# Patient Record
Sex: Female | Born: 1935 | Race: White | Hispanic: No | Smoking: Never smoker
Health system: Southern US, Community
[De-identification: ages and names within clinical notes are randomized; demographics above are authoritative.]

## PROBLEM LIST (undated history)

## (undated) DIAGNOSIS — K56609 Unspecified intestinal obstruction, unspecified as to partial versus complete obstruction: Secondary | ICD-10-CM

## (undated) HISTORY — PX: OTHER SURGICAL HISTORY: SHX169

## (undated) HISTORY — DX: Unspecified intestinal obstruction, unspecified as to partial versus complete obstruction: K56.609

## (undated) HISTORY — PX: SMALL INTESTINE SURGERY: SHX150

## (undated) HISTORY — PX: CATARACT EXTRACTION, BILATERAL: SHX1313

---

## 1973-03-14 HISTORY — PX: CHOLECYSTECTOMY: SHX55

## 2008-04-10 ENCOUNTER — Encounter: Admission: RE | Admit: 2008-04-10 | Discharge: 2008-04-10 | Payer: Self-pay | Admitting: Family Medicine

## 2008-10-13 ENCOUNTER — Ambulatory Visit (HOSPITAL_COMMUNITY): Admission: RE | Admit: 2008-10-13 | Discharge: 2008-10-13 | Payer: Self-pay | Admitting: Urology

## 2008-10-24 ENCOUNTER — Encounter: Admission: RE | Admit: 2008-10-24 | Discharge: 2008-10-24 | Payer: Self-pay | Admitting: Family Medicine

## 2010-07-18 ENCOUNTER — Emergency Department (INDEPENDENT_AMBULATORY_CARE_PROVIDER_SITE_OTHER): Payer: Medicare Other

## 2010-07-18 ENCOUNTER — Emergency Department (HOSPITAL_BASED_OUTPATIENT_CLINIC_OR_DEPARTMENT_OTHER)
Admission: EM | Admit: 2010-07-18 | Discharge: 2010-07-18 | Disposition: A | Discharge status: Home / Self Care | Payer: Medicare Other | Attending: Emergency Medicine | Admitting: Emergency Medicine

## 2010-07-18 DIAGNOSIS — K5900 Constipation, unspecified: Secondary | ICD-10-CM

## 2010-07-18 DIAGNOSIS — M4120 Other idiopathic scoliosis, site unspecified: Secondary | ICD-10-CM

## 2010-07-18 DIAGNOSIS — M549 Dorsalgia, unspecified: Secondary | ICD-10-CM | POA: Insufficient documentation

## 2010-07-18 DIAGNOSIS — M545 Low back pain, unspecified: Secondary | ICD-10-CM | POA: Insufficient documentation

## 2010-07-18 DIAGNOSIS — F0390 Unspecified dementia without behavioral disturbance: Secondary | ICD-10-CM | POA: Insufficient documentation

## 2010-07-18 DIAGNOSIS — M810 Age-related osteoporosis without current pathological fracture: Secondary | ICD-10-CM | POA: Insufficient documentation

## 2010-07-18 LAB — URINALYSIS, ROUTINE W REFLEX MICROSCOPIC
Bilirubin Urine: NEGATIVE
Glucose, UA: NEGATIVE mg/dL
Hgb urine dipstick: NEGATIVE
Ketones, ur: NEGATIVE mg/dL
Leukocytes, UA: NEGATIVE
Nitrite: NEGATIVE
Protein, ur: NEGATIVE mg/dL
Specific Gravity, Urine: 1.015 (ref 1.005–1.030)
Urobilinogen, UA: 0.2 mg/dL (ref 0.0–1.0)
pH: 8.5 — ABNORMAL HIGH (ref 5.0–8.0)

## 2010-07-18 LAB — URINE MICROSCOPIC-ADD ON

## 2010-07-18 LAB — HEMOCCULT GUIAC POC 1CARD (OFFICE): Fecal Occult Bld: NEGATIVE

## 2011-04-05 ENCOUNTER — Emergency Department (HOSPITAL_COMMUNITY): Payer: Medicare Other

## 2011-04-05 ENCOUNTER — Encounter (HOSPITAL_COMMUNITY): Payer: Self-pay | Admitting: General Practice

## 2011-04-05 ENCOUNTER — Inpatient Hospital Stay (HOSPITAL_COMMUNITY)
Admission: EM | Admit: 2011-04-05 | Discharge: 2011-04-14 | DRG: 982 | Disposition: A | Discharge status: Skilled Nursing Facility | Payer: Medicare Other | Attending: General Surgery | Admitting: General Surgery

## 2011-04-05 ENCOUNTER — Encounter (HOSPITAL_COMMUNITY): Admission: EM | Disposition: A | Discharge status: Skilled Nursing Facility | Payer: Self-pay | Source: Home / Self Care

## 2011-04-05 ENCOUNTER — Emergency Department (HOSPITAL_COMMUNITY): Payer: Medicare Other | Admitting: Anesthesiology

## 2011-04-05 ENCOUNTER — Encounter (HOSPITAL_COMMUNITY): Payer: Self-pay | Admitting: Anesthesiology

## 2011-04-05 DIAGNOSIS — D214 Benign neoplasm of connective and other soft tissue of abdomen: Principal | ICD-10-CM | POA: Diagnosis present

## 2011-04-05 DIAGNOSIS — D126 Benign neoplasm of colon, unspecified: Secondary | ICD-10-CM

## 2011-04-05 DIAGNOSIS — K56609 Unspecified intestinal obstruction, unspecified as to partial versus complete obstruction: Secondary | ICD-10-CM

## 2011-04-05 DIAGNOSIS — D490 Neoplasm of unspecified behavior of digestive system: Secondary | ICD-10-CM | POA: Diagnosis present

## 2011-04-05 DIAGNOSIS — K5900 Constipation, unspecified: Secondary | ICD-10-CM | POA: Diagnosis present

## 2011-04-05 DIAGNOSIS — K6389 Other specified diseases of intestine: Secondary | ICD-10-CM

## 2011-04-05 DIAGNOSIS — Z96619 Presence of unspecified artificial shoulder joint: Secondary | ICD-10-CM

## 2011-04-05 DIAGNOSIS — K460 Unspecified abdominal hernia with obstruction, without gangrene: Secondary | ICD-10-CM | POA: Diagnosis present

## 2011-04-05 DIAGNOSIS — M810 Age-related osteoporosis without current pathological fracture: Secondary | ICD-10-CM | POA: Diagnosis present

## 2011-04-05 DIAGNOSIS — F068 Other specified mental disorders due to known physiological condition: Secondary | ICD-10-CM | POA: Diagnosis present

## 2011-04-05 DIAGNOSIS — K560 Paralytic ileus: Secondary | ICD-10-CM | POA: Diagnosis not present

## 2011-04-05 DIAGNOSIS — Z79899 Other long term (current) drug therapy: Secondary | ICD-10-CM

## 2011-04-05 DIAGNOSIS — R32 Unspecified urinary incontinence: Secondary | ICD-10-CM | POA: Diagnosis present

## 2011-04-05 DIAGNOSIS — R5381 Other malaise: Secondary | ICD-10-CM | POA: Diagnosis not present

## 2011-04-05 HISTORY — PX: LAPAROTOMY: SHX154

## 2011-04-05 LAB — COMPREHENSIVE METABOLIC PANEL
ALT: 9 U/L (ref 0–35)
AST: 14 U/L (ref 0–37)
Albumin: 3.3 g/dL — ABNORMAL LOW (ref 3.5–5.2)
Calcium: 10.2 mg/dL (ref 8.4–10.5)
Creatinine, Ser: 0.86 mg/dL (ref 0.50–1.10)
GFR calc non Af Amer: 57 mL/min — ABNORMAL LOW (ref >90)
Sodium: 141 mEq/L (ref 135–145)
Total Protein: 6.8 g/dL (ref 6.0–8.3)

## 2011-04-05 LAB — DIFFERENTIAL
Basophils Absolute: 0 10*3/uL (ref 0.0–0.1)
Basophils Relative: 0 % (ref 0–1)
Eosinophils Absolute: 0 10*3/uL (ref 0.0–0.7)
Eosinophils Relative: 0 % (ref 0–5)
Lymphocytes Relative: 6 % — ABNORMAL LOW (ref 12–46)
Monocytes Absolute: 0.4 10*3/uL (ref 0.1–1.0)

## 2011-04-05 LAB — URINALYSIS, ROUTINE W REFLEX MICROSCOPIC
Glucose, UA: NEGATIVE mg/dL
Hgb urine dipstick: NEGATIVE
Leukocytes, UA: NEGATIVE
Protein, ur: NEGATIVE mg/dL
Specific Gravity, Urine: 1.025 (ref 1.005–1.030)

## 2011-04-05 LAB — CBC
HCT: 41.3 % (ref 36.0–46.0)
MCHC: 32.7 g/dL (ref 30.0–36.0)
MCV: 92 fL (ref 78.0–100.0)
Platelets: 349 10*3/uL (ref 150–400)
RDW: 14.3 % (ref 11.5–15.5)
WBC: 12 10*3/uL — ABNORMAL HIGH (ref 4.0–10.5)

## 2011-04-05 LAB — LACTIC ACID, PLASMA: Lactic Acid, Venous: 1 mmol/L (ref 0.5–2.2)

## 2011-04-05 SURGERY — LAPAROTOMY, EXPLORATORY
Anesthesia: General | Wound class: Dirty or Infected

## 2011-04-05 MED ORDER — IOHEXOL 300 MG/ML  SOLN
100.0000 mL | Freq: Once | INTRAMUSCULAR | Status: AC | PRN
  Administered 2011-04-05: 100 mL via INTRAVENOUS

## 2011-04-05 MED ORDER — HYDROMORPHONE HCL PF 1 MG/ML IJ SOLN
INTRAMUSCULAR | Status: AC
  Filled 2011-04-05: qty 1

## 2011-04-05 MED ORDER — PROMETHAZINE HCL 25 MG/ML IJ SOLN
INTRAMUSCULAR | Status: AC
  Filled 2011-04-05: qty 1

## 2011-04-05 MED ORDER — SUCCINYLCHOLINE CHLORIDE 20 MG/ML IJ SOLN
INTRAMUSCULAR | Status: DC | PRN
  Administered 2011-04-05: 80 mg via INTRAVENOUS

## 2011-04-05 MED ORDER — 0.9 % SODIUM CHLORIDE (POUR BTL) OPTIME
TOPICAL | Status: DC | PRN
  Administered 2011-04-05: 1000 mL

## 2011-04-05 MED ORDER — ONDANSETRON HCL 4 MG/2ML IJ SOLN: 4.0000 mg | Freq: Four times a day (QID) | INTRAMUSCULAR | Status: DC | PRN

## 2011-04-05 MED ORDER — DIPHENHYDRAMINE HCL 50 MG/ML IJ SOLN
12.5000 mg | Freq: Four times a day (QID) | INTRAMUSCULAR | Status: DC | PRN
  Administered 2011-04-09 – 2011-04-10 (×2): 12.5 mg via INTRAVENOUS
  Filled 2011-04-05 (×2): qty 1

## 2011-04-05 MED ORDER — LIDOCAINE HCL (CARDIAC) 20 MG/ML IV SOLN
INTRAVENOUS | Status: DC | PRN
  Administered 2011-04-05: 75 mg via INTRAVENOUS

## 2011-04-05 MED ORDER — DIPHENHYDRAMINE HCL 12.5 MG/5ML PO ELIX: 12.5000 mg | ORAL_SOLUTION | Freq: Four times a day (QID) | ORAL | Status: DC | PRN

## 2011-04-05 MED ORDER — ONDANSETRON HCL 4 MG/2ML IJ SOLN
4.0000 mg | Freq: Once | INTRAMUSCULAR | Status: AC
  Administered 2011-04-05: 4 mg via INTRAVENOUS
  Filled 2011-04-05: qty 2

## 2011-04-05 MED ORDER — CHLORHEXIDINE GLUCONATE 4 % EX LIQD: 1.0000 "application " | Freq: Once | CUTANEOUS | Status: DC

## 2011-04-05 MED ORDER — NALOXONE HCL 0.4 MG/ML IJ SOLN: 0.4000 mg | INTRAMUSCULAR | Status: DC | PRN

## 2011-04-05 MED ORDER — LACTATED RINGERS IV SOLN: INTRAVENOUS | Status: DC

## 2011-04-05 MED ORDER — CEFOXITIN SODIUM-DEXTROSE 1-4 GM-% IV SOLR (PREMIX)
INTRAVENOUS | Status: AC
  Filled 2011-04-05: qty 50

## 2011-04-05 MED ORDER — CEFOXITIN SODIUM 1 G IV SOLR
1.0000 g | Freq: Four times a day (QID) | INTRAVENOUS | Status: AC
  Administered 2011-04-05: 1 g via INTRAVENOUS
  Filled 2011-04-05: qty 1

## 2011-04-05 MED ORDER — ENOXAPARIN SODIUM 40 MG/0.4ML ~~LOC~~ SOLN
40.0000 mg | SUBCUTANEOUS | Status: DC
  Administered 2011-04-06 – 2011-04-14 (×8): 40 mg via SUBCUTANEOUS
  Filled 2011-04-05 (×10): qty 0.4

## 2011-04-05 MED ORDER — PROMETHAZINE HCL 25 MG/ML IJ SOLN: 6.2500 mg | INTRAMUSCULAR | Status: DC | PRN

## 2011-04-05 MED ORDER — ONDANSETRON HCL 4 MG PO TABS: 4.0000 mg | ORAL_TABLET | Freq: Four times a day (QID) | ORAL | Status: DC | PRN

## 2011-04-05 MED ORDER — HYDROMORPHONE 0.3 MG/ML IV SOLN
INTRAVENOUS | Status: DC
  Administered 2011-04-05: 0.3 mg via INTRAVENOUS
  Administered 2011-04-06: 1.8 mg via INTRAVENOUS
  Administered 2011-04-06: 1.5 mg via INTRAVENOUS
  Administered 2011-04-06: 19:00:00 via INTRAVENOUS
  Administered 2011-04-06: 0.56 mg via INTRAVENOUS
  Administered 2011-04-07: 1.2 mg via INTRAVENOUS
  Administered 2011-04-07: 0.6 mg via INTRAVENOUS
  Filled 2011-04-05 (×2): qty 25

## 2011-04-05 MED ORDER — ONDANSETRON HCL 4 MG/2ML IJ SOLN
4.0000 mg | Freq: Once | INTRAMUSCULAR | Status: AC
  Administered 2011-04-05: 4 mg via INTRAVENOUS

## 2011-04-05 MED ORDER — LACTATED RINGERS IV SOLN
INTRAVENOUS | Status: DC | PRN
  Administered 2011-04-05 (×3): via INTRAVENOUS

## 2011-04-05 MED ORDER — ONDANSETRON HCL 4 MG/2ML IJ SOLN
INTRAMUSCULAR | Status: AC
  Administered 2011-04-05: 4 mg via INTRAVENOUS
  Filled 2011-04-05: qty 2

## 2011-04-05 MED ORDER — SUFENTANIL CITRATE 50 MCG/ML IV SOLN
INTRAVENOUS | Status: DC | PRN
  Administered 2011-04-05: 20 ug via INTRAVENOUS
  Administered 2011-04-05 (×3): 10 ug via INTRAVENOUS

## 2011-04-05 MED ORDER — CISATRACURIUM BESYLATE 2 MG/ML IV SOLN
INTRAVENOUS | Status: DC | PRN
  Administered 2011-04-05: 10 mg via INTRAVENOUS
  Administered 2011-04-05: 3 mg via INTRAVENOUS

## 2011-04-05 MED ORDER — PROPOFOL 10 MG/ML IV EMUL
INTRAVENOUS | Status: DC | PRN
  Administered 2011-04-05 (×2): 100 mg via INTRAVENOUS

## 2011-04-05 MED ORDER — NEOSTIGMINE METHYLSULFATE 1 MG/ML IJ SOLN
INTRAMUSCULAR | Status: DC | PRN
  Administered 2011-04-05: 5 mg via INTRAVENOUS

## 2011-04-05 MED ORDER — MORPHINE SULFATE 2 MG/ML IJ SOLN
2.0000 mg | Freq: Once | INTRAMUSCULAR | Status: AC
  Administered 2011-04-05: 2 mg via INTRAVENOUS
  Filled 2011-04-05: qty 1

## 2011-04-05 MED ORDER — ACETAMINOPHEN 10 MG/ML IV SOLN
INTRAVENOUS | Status: DC | PRN
  Administered 2011-04-05: 1000 mg via INTRAVENOUS

## 2011-04-05 MED ORDER — SODIUM CHLORIDE 0.9 % IV BOLUS (SEPSIS)
500.0000 mL | Freq: Once | INTRAVENOUS | Status: AC
  Administered 2011-04-05: 500 mL via INTRAVENOUS

## 2011-04-05 MED ORDER — HETASTARCH-ELECTROLYTES 6 % IV SOLN
INTRAVENOUS | Status: DC | PRN
  Administered 2011-04-05: 16:00:00 via INTRAVENOUS

## 2011-04-05 MED ORDER — HYDROMORPHONE HCL PF 1 MG/ML IJ SOLN
0.2500 mg | INTRAMUSCULAR | Status: DC | PRN
  Administered 2011-04-05 (×2): 0.25 mg via INTRAVENOUS

## 2011-04-05 MED ORDER — SODIUM CHLORIDE 0.9 % IJ SOLN: 9.0000 mL | INTRAMUSCULAR | Status: DC | PRN

## 2011-04-05 MED ORDER — DEXTROSE 5 % IV SOLN
1.0000 g | INTRAVENOUS | Status: DC | PRN
  Administered 2011-04-05: 2 g via INTRAVENOUS

## 2011-04-05 MED ORDER — EPHEDRINE SULFATE 50 MG/ML IJ SOLN
INTRAMUSCULAR | Status: DC | PRN
  Administered 2011-04-05 (×2): 10 mg via INTRAVENOUS

## 2011-04-05 MED ORDER — GLYCOPYRROLATE 0.2 MG/ML IJ SOLN
INTRAMUSCULAR | Status: DC | PRN
  Administered 2011-04-05: .8 mg via INTRAVENOUS

## 2011-04-05 MED ORDER — DEXTROSE 5 % IV SOLN: 1.0000 g | INTRAVENOUS | Status: DC | PRN

## 2011-04-05 MED ORDER — DEXTROSE 5 % IV SOLN: 1.0000 g | INTRAVENOUS | Status: DC

## 2011-04-05 MED ORDER — KCL IN DEXTROSE-NACL 20-5-0.45 MEQ/L-%-% IV SOLN
INTRAVENOUS | Status: DC
  Administered 2011-04-06 – 2011-04-07 (×3): via INTRAVENOUS
  Filled 2011-04-05 (×5): qty 1000

## 2011-04-05 MED ORDER — ACETAMINOPHEN 10 MG/ML IV SOLN
INTRAVENOUS | Status: AC
  Filled 2011-04-05: qty 100

## 2011-04-05 SURGICAL SUPPLY — 46 items
APPLICATOR COTTON TIP 6IN STRL (MISCELLANEOUS) ×2 IMPLANT
BLADE EXTENDED COATED 6.5IN (ELECTRODE) IMPLANT
BLADE HEX COATED 2.75 (ELECTRODE) ×2 IMPLANT
CANISTER SUCTION 2500CC (MISCELLANEOUS) ×2 IMPLANT
CLOTH BEACON ORANGE TIMEOUT ST (SAFETY) ×2 IMPLANT
COVER MAYO STAND STRL (DRAPES) IMPLANT
DRAPE LAPAROSCOPIC ABDOMINAL (DRAPES) ×2 IMPLANT
DRAPE UTILITY XL STRL (DRAPES) ×2 IMPLANT
DRAPE WARM FLUID 44X44 (DRAPE) ×2 IMPLANT
DRSG PAD ABDOMINAL 8X10 ST (GAUZE/BANDAGES/DRESSINGS) ×2 IMPLANT
ELECT REM PT RETURN 9FT ADLT (ELECTROSURGICAL) ×2
ELECTRODE REM PT RTRN 9FT ADLT (ELECTROSURGICAL) ×1 IMPLANT
GAUZE SPONGE 4X4 12PLY STRL LF (GAUZE/BANDAGES/DRESSINGS) ×2 IMPLANT
GLOVE BIOGEL PI IND STRL 7.0 (GLOVE) ×1 IMPLANT
GLOVE BIOGEL PI INDICATOR 7.0 (GLOVE) ×1
GLOVE INDICATOR 8.0 STRL GRN (GLOVE) ×4 IMPLANT
GLOVE SS BIOGEL STRL SZ 8 (GLOVE) ×2 IMPLANT
GLOVE SUPERSENSE BIOGEL SZ 8 (GLOVE) ×2
GOWN STRL NON-REIN LRG LVL3 (GOWN DISPOSABLE) ×2 IMPLANT
GOWN STRL REIN XL XLG (GOWN DISPOSABLE) ×4 IMPLANT
KIT BASIN OR (CUSTOM PROCEDURE TRAY) ×2 IMPLANT
LIGASURE IMPACT 36 18CM CVD LR (INSTRUMENTS) ×2 IMPLANT
NS IRRIG 1000ML POUR BTL (IV SOLUTION) ×6 IMPLANT
PACK GENERAL/GYN (CUSTOM PROCEDURE TRAY) ×2 IMPLANT
RELOAD PROXIMATE 75MM BLUE (ENDOMECHANICALS) ×10 IMPLANT
RELOAD PROXIMATE TA60MM BLUE (ENDOMECHANICALS) ×2 IMPLANT
SPONGE GAUZE 4X4 12PLY (GAUZE/BANDAGES/DRESSINGS) ×2 IMPLANT
SPONGE LAP 18X18 X RAY DECT (DISPOSABLE) ×6 IMPLANT
STAPLER GUN LINEAR PROX 60 (STAPLE) ×2 IMPLANT
STAPLER PROXIMATE 75MM BLUE (STAPLE) ×6 IMPLANT
STAPLER VISISTAT 35W (STAPLE) ×2 IMPLANT
SUCTION POOLE TIP (SUCTIONS) ×2 IMPLANT
SUT PDS AB 1 CT1 27 (SUTURE) ×2 IMPLANT
SUT PDS AB 1 CTX 36 (SUTURE) IMPLANT
SUT PDS AB 1 TP1 96 (SUTURE) ×4 IMPLANT
SUT SILK 2 0 (SUTURE) ×1
SUT SILK 2-0 18XBRD TIE 12 (SUTURE) ×1 IMPLANT
SUT SILK 3 0 (SUTURE) ×1
SUT SILK 3 0 SH CR/8 (SUTURE) ×2 IMPLANT
SUT SILK 3-0 18XBRD TIE 12 (SUTURE) ×1 IMPLANT
SUT VIC AB 2-0 SH 18 (SUTURE) IMPLANT
SUT VIC AB 3-0 SH 18 (SUTURE) ×6 IMPLANT
TAPE CLOTH SURG 4X10 WHT LF (GAUZE/BANDAGES/DRESSINGS) ×2 IMPLANT
TOWEL OR 17X26 10 PK STRL BLUE (TOWEL DISPOSABLE) ×2 IMPLANT
TRAY FOLEY CATH 14FRSI W/METER (CATHETERS) ×2 IMPLANT
YANKAUER SUCT BULB TIP NO VENT (SUCTIONS) ×2 IMPLANT

## 2011-04-05 NOTE — Anesthesia Postprocedure Evaluation (Signed)
  Anesthesia Post-op Note  Patient: Destiny Cochran  Procedure(s) Performed:  EXPLORATORY LAPAROTOMY - Cecectomy, small bowel resection  Patient Location: PACU  Anesthesia Type: General  Level of Consciousness: awake and alert   Airway and Oxygen Therapy: Patient Spontanous Breathing  Post-op Pain: mild  Post-op Assessment: Post-op Vital signs reviewed, Patient's Cardiovascular Status Stable, Respiratory Function Stable, Patent Airway and No signs of Nausea or vomiting  Post-op Vital Signs: stable  Complications: No apparent anesthesia complications

## 2011-04-05 NOTE — Anesthesia Preprocedure Evaluation (Addendum)
Anesthesia Evaluation  Patient identified by MRN, date of birth, ID band Patient awake    Reviewed: Allergy & Precautions, H&P , NPO status , Patient's Chart, lab work & pertinent test results  Airway Mallampati: III TM Distance: <3 FB Neck ROM: full    Dental  (+) Caps,    Pulmonary neg pulmonary ROS,  clear to auscultation  Pulmonary exam normal       Cardiovascular Exercise Tolerance: Good neg cardio ROS regular Normal    Neuro/Psych Negative Neurological ROS  Negative Psych ROS   GI/Hepatic negative GI ROS, Neg liver ROS,   Endo/Other  Negative Endocrine ROS  Renal/GU negative Renal ROS  Genitourinary negative   Musculoskeletal   Abdominal   Peds  Hematology negative hematology ROS (+)   Anesthesia Other Findings   Reproductive/Obstetrics negative OB ROS                         Anesthesia Physical Anesthesia Plan  ASA: II  Anesthesia Plan: General   Post-op Pain Management:    Induction: Intravenous, Rapid sequence and Cricoid pressure planned  Airway Management Planned: Oral ETT  Additional Equipment:   Intra-op Plan:   Post-operative Plan: Extubation in OR  Informed Consent: I have reviewed the patients History and Physical, chart, labs and discussed the procedure including the risks, benefits and alternatives for the proposed anesthesia with the patient or authorized representative who has indicated his/her understanding and acceptance.   Dental Advisory Given  Plan Discussed with: CRNA and Surgeon  Anesthesia Plan Comments:         Anesthesia Quick Evaluation

## 2011-04-05 NOTE — Preoperative (Signed)
Beta Blockers   Reason not to administer Beta Blockers:Not Applicable 

## 2011-04-05 NOTE — H&P (Signed)
Pt seen and agree.  Needs exploratory laparotomy and probable resection  Closed loop obstruction noted.  No other good options and patient understands.  She wishes to proceed.Risks include bleeding,  Infection,  Death,  Bowel injury or leak from anastamosis ,  cardiac events,  Wound complications.  She agrees to proceed.

## 2011-04-05 NOTE — Op Note (Signed)
Preop diagnosis: Small bowel obstruction  Postop diagnosis: Small bowel obstruction secondary to internal hernia and mass in right lower quadrant adjacent to appendix   Procedure: Exploratory laparotomy with partial colectomy and small bowel resection of mid jejunum  Surgeon: Harriette Bouillon M.D.  Assistant: Barnetta Chapel PA  Anesthesia: Gen. endotracheal anesthesia  EBL: Less than 50 cc  Drains none  IV fluids approximately 3000 cc of crystalloid  Specimen: Cecum and terminal ileum and small segment of mid jejunum  Indications for procedure: The patient is a 78 year old female admitted for abdominal pain, nausea and vomiting. CT scan showed an internal hernia involving her small bowel and a mass in the right lower quadrant. We discussed treatment options with the patient and her daughter. She had signs of a closed loop bowel obstruction I felt this was an emergent. Patient and daughter wish to proceed with surgical intervention. Risks include bleeding, infection, death, wound complications, bowel resection, anastomotic leak, sepsis, cardiovascular events, pulmonary failure, DVT. He understood the potential risks as well as her advanced age as a risk factor for complications but wished to proceed  Description of procedure: The patient was met in the holding area. Questions were answered. Her CT scan was reviewed as well as her history physical and examination. She was taken to the operating room and placed supine. Foley catheter is placed after the induction of general anesthesia under sterile conditions. Abdomen was prepped and draped in a sterile fashion. She received 1 g of cefoxitin. Timeout was done. A midline incision was used the abdominal cavity is entered. There is about 500 cc of ascites. This was suctioned out. A mass at the tip of the appendix identified which 10 cm in size in the mid jejunum was adherent to this. This created a space where the terminal ileum was tract creating a  closed loop obstruction. The terminal ileum was necrotic to the ileocecal valve. This was approximately 20 cm of the terminal ileum. At that resection of this the cecum was necessary. Using a GIA-75 stapling device, the terminal ileum was divided. The proximal cecum was also divided with a stapling device. The mass appeared to come off of the tip of the appendix. The mid jejunal small bowel segment that was adherent to the tumor was also resected using a GIA-75 stapler device. The mass was removed en bloc. The LigaSure was used to divide the mesentery. The specimen which consisted of the cecum appendix with mass the mid jejunal bowel segment adherent to the tumor at approximately 20 cm of the terminal ileum were all removed and sent as specimen. The jejunum was reanastomosed with a GIA 75 stapling device with the common enterotomy closed with a TA 60. A small bowel to a sending colon anastomosis created in a similar fashion using the GIA 75 the device and a TA 60 to close the common enterotomy. Through heart was used at the staple line in the crotch of both anastomoses. Both anastomoses were widely patent with no signs of bleeding. There was contamination of small bowel and colonic contents since she was instructed. The abdominal cavity is irrigated with 3 L of warm normal saline. NG tube was placed. The fascia was closed with a double-stranded #1 PDS. The skin was left open and packed with saline soaked gauze. Dry dressings were applied. Of note she had a large uterine fibroid but otherwise remainder of her exploratory laparotomy revealed no signs of other abnormality. She was awoke taken to recovery in satisfactory condition. All  final counts are found to be correct.

## 2011-04-05 NOTE — Progress Notes (Signed)
Pt admitted to the floor from PACU, was hooked up to all monitors and VSS, pt A&Ox3, no c/o pain at this time, PCA instructions given. Report given to night RN.

## 2011-04-05 NOTE — H&P (Signed)
Destiny Cochran [birth year]  [id number].   Primary Care MD: Dr. Doreatha Martin Requesting MD: Dr. Hyman Hopes Chief Complaint/Reason for Consult: closed loop small bowel obstruction HPI: This is a very pleasant 78 yo WF who has had difficulty with constipation over the last several years.  She has come to the ED twice this past year for an inability to have a bowel movement.  She was given Miralax and discharged back to assisted living.    Last night the patient started developing RLQ abdominal pain along with nausea and emesis.  She was awake all night and decided to call her daughter secondary to pain.  She did have a BM this morning.  She was brought here to the [facility] and had a CT scan that revealed a closed loop small bowel obstruction and a small bowel tumor that measured 8x6cm.  We have been called to see the patient for surgical admission.  She has no other complaints.  Review of Systems: Please see HPI, otherwise all other systems have been reviewed and are negative.  She does admit to slight dizziness that just started.  Family History  Problem Relation Age of Onset  . Cancer Mother     pancreatic    Past Medical History  Diagnosis Date  . Osteoporosis   . Constipation     Past Surgical History  Procedure Date  . Cholecystectomy 1975  . Bilateral total shoulder arthroplasties   . Cataract extraction, bilateral   -Vaginal Bladder tact  Social History:  reports that she has never smoked. She does not have any smokeless tobacco history on file. She reports that she does not drink alcohol or use illicit drugs.  Allergies: No Known Allergies  Medications Prior to Admission  Medication Dose Route Frequency Provider Last Rate Last Dose  . iohexol (OMNIPAQUE) 300 MG/ML solution 100 mL  100 mL Intravenous Once PRN Medication Radiologist, MD   100 mL at 04/05/11 1211  . morphine 2 MG/ML injection 2 mg  2 mg Intravenous Once Forbes Cellar, MD   2 mg at 04/05/11 0931  . ondansetron  (ZOFRAN) 4 MG/2ML injection           . ondansetron (ZOFRAN) injection 4 mg  4 mg Intravenous Once Forbes Cellar, MD   4 mg at 04/05/11 0929  . ondansetron (ZOFRAN) injection 4 mg  4 mg Intravenous Once Forbes Cellar, MD   4 mg at 04/05/11 1054  . sodium chloride 0.9 % bolus 500 mL  500 mL Intravenous Once Forbes Cellar, MD   500 mL at 04/05/11 4098   No current outpatient prescriptions on file as of 04/05/2011.    Blood pressure 151/71, pulse 65, temperature 98.5 F (36.9 C), temperature source Oral, resp. rate 18, SpO2 93.00%. Physical Exam: General: pleasant, obese 78 yo WF who is currently sitting up in a chair as this relieves her pain, but is in NAD HEENT: head is normocephalic, atraumatic.  Sclera are noninjected.  PERRL.  No rhinorrhea.  Mouth is pink. Heart: regular rate and rhythm, normal s1,s2.  No murmurs, gallops, or rubs.  Palpable radial and pedal pulses bilaterally. Lungs: CTAB, no wheezes, rhonchi, or rales noted.  Respiratory effort is nonlabored. Abd: soft, tender on right side of abdomen, greatest in RLQ.  She does has a protrusion noted on the right side of her abdomen, likely secondary to current problem and distention.  Hypoactive bowel sounds.  + voluntary guarding with palpation.  No peritoneal signs.  Reducible umbilical hernia. MS: all  4 extremities are symmetrical with  No cyanosis, clubbing.  She does has significant +2 lower extremity edema Skin: warm and dry with seborrheic keratoses all over her abdomen.  Otherwise no obvious other masses or lesions. Psych: A&Ox3 with appropriate affect.   Results for orders placed during the hospital encounter of 04/05/11 (from the past 48 hour(s))  CBC     Status: Abnormal   Collection Time   04/05/11  8:40 AM      Component Value Range Comment   WBC 12.0 (*) 4.0 - 10.5 (K/uL)    RBC 4.49  3.87 - 5.11 (MIL/uL)    Hemoglobin 13.5  12.0 - 15.0 (g/dL)    HCT 96.0  45.4 - 09.8 (%)    MCV 92.0  78.0 - 100.0 (fL)    MCH 30.1   26.0 - 34.0 (pg)    MCHC 32.7  30.0 - 36.0 (g/dL)    RDW 11.9  14.7 - 82.9 (%)    Platelets 349  150 - 400 (K/uL)   DIFFERENTIAL     Status: Abnormal   Collection Time   04/05/11  8:40 AM      Component Value Range Comment   Neutrophils Relative 91 (*) 43 - 77 (%)    Neutro Abs 10.9 (*) 1.7 - 7.7 (K/uL)    Lymphocytes Relative 6 (*) 12 - 46 (%)    Lymphs Abs 0.7  0.7 - 4.0 (K/uL)    Monocytes Relative 3  3 - 12 (%)    Monocytes Absolute 0.4  0.1 - 1.0 (K/uL)    Eosinophils Relative 0  0 - 5 (%)    Eosinophils Absolute 0.0  0.0 - 0.7 (K/uL)    Basophils Relative 0  0 - 1 (%)    Basophils Absolute 0.0  0.0 - 0.1 (K/uL)   COMPREHENSIVE METABOLIC PANEL     Status: Abnormal   Collection Time   04/05/11  8:40 AM      Component Value Range Comment   Sodium 141  135 - 145 (mEq/L)    Potassium 3.9  3.5 - 5.1 (mEq/L)    Chloride 106  96 - 112 (mEq/L)    CO2 25  19 - 32 (mEq/L)    Glucose, Bld 136 (*) 70 - 99 (mg/dL)    BUN 18  6 - 23 (mg/dL)    Creatinine, Ser 5.62  0.50 - 1.10 (mg/dL)    Calcium 13.0  8.4 - 10.5 (mg/dL)    Total Protein 6.8  6.0 - 8.3 (g/dL)    Albumin 3.3 (*) 3.5 - 5.2 (g/dL)    AST 14  0 - 37 (U/L)    ALT 9  0 - 35 (U/L)    Alkaline Phosphatase 77  39 - 117 (U/L)    Total Bilirubin 0.3  0.3 - 1.2 (mg/dL)    GFR calc non Af Amer 57 (*) >90 (mL/min)    GFR calc Af Amer 66 (*) >90 (mL/min)   LIPASE, BLOOD     Status: Normal   Collection Time   04/05/11  8:40 AM      Component Value Range Comment   Lipase 19  11 - 59 (U/L)   LACTIC ACID, PLASMA     Status: Normal   Collection Time   04/05/11  8:40 AM      Component Value Range Comment   Lactic Acid, Venous 1.0  0.5 - 2.2 (mmol/L)   URINALYSIS, ROUTINE W REFLEX MICROSCOPIC  Status: Abnormal   Collection Time   04/05/11  9:40 AM      Component Value Range Comment   Color, Urine YELLOW  YELLOW     APPearance CLOUDY (*) CLEAR     Specific Gravity, Urine 1.025  1.005 - 1.030     pH 7.0  5.0 - 8.0     Glucose,  UA NEGATIVE  NEGATIVE (mg/dL)    Hgb urine dipstick NEGATIVE  NEGATIVE     Bilirubin Urine NEGATIVE  NEGATIVE     Ketones, ur TRACE (*) NEGATIVE (mg/dL)    Protein, ur NEGATIVE  NEGATIVE (mg/dL)    Urobilinogen, UA 0.2  0.0 - 1.0 (mg/dL)    Nitrite NEGATIVE  NEGATIVE     Leukocytes, UA NEGATIVE  NEGATIVE  MICROSCOPIC NOT DONE ON URINES WITH NEGATIVE PROTEIN, BLOOD, LEUKOCYTES, NITRITE, OR GLUCOSE <1000 mg/dL.   Ct Abdomen Pelvis W Contrast  04/05/2011  *RADIOLOGY REPORT*  Clinical Data: Diffuse abdominal pain and vomiting.  CT ABDOMEN AND PELVIS WITH CONTRAST  Technique:  Multidetector CT imaging of the abdomen and pelvis was performed following the standard protocol during bolus administration of intravenous contrast.  Contrast: OMNIPAQUE IOHEXOL 300 MG/ML IV SOLN  Comparison: None  Findings: The lung bases are clear except for dependent atelectasis.  The liver is unremarkable.  No focal lesions or biliary dilatation. The gallbladder is surgically absent.  There is moderate fluid in the right upper quadrant, surrounding the liver and in the right paracolic gutter.  The spleen is normal in size.  No focal lesions. Adjacent calcifications are noted and may be related to previous trauma.  The pancreas demonstrates atrophy.  No focal lesions or inflammation.  Adrenal glands and kidneys are normal except for bilateral parapelvic cysts.  The stomach is unremarkable and the duodenum appears normal.  The proximal small bowel loops are normal.  The mid distal small bowel loops demonstrate obstructive findings with findings very suspicious for a closed loop obstruction in the right abdomen. No pneumatosis.  In the central mid abdomen there is a 19 cm mass involving the small bowel.  This mass may be causing the twisting of the mesentery and subsequent closed loop obstruction.  The colon is completely decompressed.  There is moderate free pelvic fluid.  The uterus is enlarged and there is a probable large  fundal fibroid which is generated.  The ovaries appear normal.  Moderate free pelvic fluid.  The bladder is unremarkable.  A right inguinal hernia is noted containing only fat.  The aorta demonstrates tortuosity and atherosclerotic changes but no focal aneurysm.  No enlarged mesenteric or retroperitoneal lymph nodes.  Scattered nodes are noted.  The bony structures are intact.  A compression deformity of T12 is noted.  IMPRESSION:  1.  Closed loop small bowel obstruction in the right abdomen. 2.  8 x 6 cm small bowel mass in the mid abdomen.  Lymphoma, sarcoma and adenocarcinoma are considerations. 3.  Moderate abdominal and pelvic ascites. 4.  Large degenerated fundal fibroid.  Critical Value/emergent results were called by telephone at the time of interpretation on 04/05/2011  at 12:45 p.m.  to  Dr. Hyman Hopes, who verbally acknowledged these results.  Original Report Authenticated By: P. Loralie Champagne, M.D.       Assessment/Plan 1. Closed loop small bowel obstruction with small bowel mass 2. Osteoporosis 3. Constipation  Plan: 1. Given the patient's CT scan findings, we will plan on emergent exploration of the patient's abdomen.  I have  discussed this along with potential outcomes with the patient as well as her daughter, who is present.  I have explained the procedure, including risks and possible complications, including but not limited to, bleeding, infection, wound infection, small bowel resection, anastomotic leak requiring re-exploration, myocardial infarction, and prolonged ventilation.  They both understand and wish to proceed.  Renada Cronin E 04/05/2011, 1:52 PM

## 2011-04-05 NOTE — ED Notes (Signed)
Per EMS patient arrived from [facility], patient c/o generalized abdominal pain with vomiting. No N/V present currently. Patient reports normal bowel movement with no blood in stool or emesis. Patient currently c/o abd tenderness.

## 2011-04-05 NOTE — ED Notes (Signed)
RUE:[id number]<JW> Expected date:04/05/11<BR> Expected time: 7:41 AM<BR> Means of arrival:Ambulance<BR> Comments:<BR> abd pain with vomiting

## 2011-04-05 NOTE — Transfer of Care (Signed)
Immediate Anesthesia Transfer of Care Note  Patient: Destiny Cochran  Procedure(s) Performed:  EXPLORATORY LAPAROTOMY - Cecectomy, small bowel resection  Patient Location: PACU  Anesthesia Type: General  Level of Consciousness: confused  Airway & Oxygen Therapy: Patient Spontanous Breathing and Patient connected to face mask oxygen  Post-op Assessment: Report given to PACU RN and Post -op Vital signs reviewed and stable  Post vital signs: Reviewed and stable  Complications: No apparent anesthesia complications

## 2011-04-05 NOTE — ED Provider Notes (Signed)
History     CSN: [id number]  Arrival date & time 04/05/11  0754   First MD Initiated Contact with Patient 04/05/11 [phone]      Chief Complaint  Patient presents with  . Abdominal Pain  . Emesis    (Consider location/radiation/quality/duration/timing/severity/associated sxs/prior treatment) HPI  89yoF generally in good health presents with abdominal pain. Patient states that she began to experience abdominal pain several hours ago prior to want to bed last night. She states she is unable to sleep secondary to pain. She rates the pain as a 7/10 at this time. She describes it as a generalized cramping sharp pain across her diffuse abdomen. She complains of nausea. She's had 2 episodes of bilious emesis. She describes emesis is nonbloody. She had a small bowel movement this morning which was unremarkable. She states that she's not sure she's passing gas. She feels like she is "blocked up". She feels distended. Denies fever, chills. Denies sick contacts. Denies hematuria, dysuria, frequency, urgency. She denies back pain. No history of similar. She denies chest pain, shortness of breath, diaphoresis.  Abdominal surgeries include history of cholecystectomy   ED Notes, ED Provider Notes from 04/05/11 0000 to 04/05/11 08:10:24       Nicky Pugh, RN 04/05/2011 08:00      Per EMS patient arrived from [facility], patient c/o generalized abdominal pain with vomiting. No N/V present currently. Patient reports normal bowel movement with no blood in stool or emesis. Patient currently c/o abd tenderness.     Past Medical History  Diagnosis Date  . Osteoporosis     Past Surgical History  Procedure Date  . Cholecystectomy 1975  . Bilateral total shoulder arthroplasties   . Cataract extraction, bilateral     History reviewed. No pertinent family history.  History  Substance Use Topics  . Smoking status: Never Smoker   . Smokeless tobacco: Not on file  . Alcohol Use: No    OB History     Grav Para Term Preterm Abortions TAB SAB Ect Mult Living                  Review of Systems  All other systems reviewed and are negative.  except as noted HPI  Allergies  Review of patient's allergies indicates no known allergies.  Home Medications   Current Outpatient Rx  Name Route Sig Dispense Refill  . ALENDRONATE SODIUM 70 MG PO TABS Oral Take 70 mg by mouth every 7 (seven) days. On Friday. Take with a full glass of water on an empty stomach.    . AMOXICILLIN 500 MG PO CAPS Oral Take 500 mg by mouth once as needed. One hour before dental appointment    . CALCIUM CARBONATE-VITAMIN D 600-400 MG-UNIT PO TABS Oral Take 1 tablet by mouth daily.    Marland Kitchen VITAMIN D 1000 UNITS PO TABS Oral Take 1,000 Units by mouth daily.    . CHROMIUM 200 MCG PO TABS Oral Take 1 tablet by mouth daily.    Marland Kitchen FIRST-DUKES MOUTHWASH MT Mouth/Throat Use as directed 15 mLs in the mouth or throat every 3 (three) hours as needed. For sore mouth.    . DOCUSATE SODIUM 100 MG PO CAPS Oral Take 100 mg by mouth 2 (two) times daily as needed.    Marland Kitchen FOLIC ACID 1 MG PO TABS Oral Take 1 mg by mouth daily.    . FUROSEMIDE 20 MG PO TABS Oral Take 20 mg by mouth daily as needed. For Edema    .  IBUPROFEN 200 MG PO TABS Oral Take 400 mg by mouth 3 (three) times daily with meals as needed. For pain    . LACTULOSE 10 GM/15ML PO SOLN Oral Take 20 g by mouth every 4 (four) hours as needed. For Bowel Movement    . MAGNESIUM HYDROXIDE 400 MG/5ML PO SUSP Oral Take 30 mLs by mouth 2 (two) times daily as needed. For constipation    . ADULT MULTIVITAMIN W/MINERALS CH Oral Take 1 tablet by mouth daily.    . OMEGA-3-ACID ETHYL ESTERS 1 G PO CAPS Oral Take 1 g by mouth daily.    . OXYBUTYNIN CHLORIDE ER 10 MG PO TB24 Oral Take 10 mg by mouth at bedtime.    Marland Kitchen POLYETHYLENE GLYCOL 3350 PO PACK Oral Take 17 g by mouth daily as needed. For constipation    . POTASSIUM 99 MG PO TABS Oral Take 1 tablet by mouth daily.    Marland Kitchen VITAMIN B-6 50 MG PO  TABS Oral Take 50 mg by mouth daily.    . SELENIUM ER 200 MCG PO TBCR Oral Take 1 tablet by mouth daily.    . TRAMADOL HCL 50 MG PO TABS Oral Take 50 mg by mouth 2 (two) times daily.    Marland Kitchen VITAMIN B-12 500 MCG PO TABS Oral Take 500 mcg by mouth daily.    Marland Kitchen VITAMIN C 500 MG PO TABS Oral Take 500 mg by mouth daily.    Marland Kitchen VITAMIN E 400 UNITS PO CAPS Oral Take 400 Units by mouth daily.      BP 151/71  Pulse 65  Temp(Src) 98.5 F (36.9 C) (Oral)  Resp 18  SpO2 93%  Physical Exam  Nursing note and vitals reviewed. Constitutional: She is oriented to person, place, and time. She appears well-developed.  HENT:  Head: Atraumatic.  Mouth/Throat: Oropharynx is clear and moist.  Eyes: Conjunctivae and EOM are normal. Pupils are equal, round, and reactive to light.  Neck: Normal range of motion. Neck supple.  Cardiovascular: Normal rate, regular rhythm, normal heart sounds and intact distal pulses.   Pulmonary/Chest: Effort normal and breath sounds normal. No respiratory distress. She has no wheezes. She has no rales.  Abdominal: Soft. She exhibits distension. She exhibits no mass. There is tenderness. There is no rebound and no guarding.       difffuse abd ttp > R abd  Musculoskeletal: Normal range of motion.  Neurological: She is alert and oriented to person, place, and time.  Skin: Skin is warm and dry. No rash noted.  Psychiatric: She has a normal mood and affect.    Date: 04/05/2011  Rate: 62  Rhythm: normal sinus rhythm  QRS Axis: normal  Intervals: normal  ST/T Wave abnormalities: nonspecific T wave changes  Conduction Disutrbances:none  Narrative Interpretation:   Old EKG Reviewed: none available  ED Course  Procedures (including critical care time)  Labs Reviewed  CBC - Abnormal; Notable for the following:    WBC 12.0 (*)    All other components within normal limits  DIFFERENTIAL - Abnormal; Notable for the following:    Neutrophils Relative 91 (*)    Neutro Abs 10.9 (*)      Lymphocytes Relative 6 (*)    All other components within normal limits  COMPREHENSIVE METABOLIC PANEL - Abnormal; Notable for the following:    Glucose, Bld 136 (*)    Albumin 3.3 (*)    GFR calc non Af Amer 57 (*)    GFR calc Af Denyse Dago  48 (*)    All other components within normal limits  URINALYSIS, ROUTINE W REFLEX MICROSCOPIC - Abnormal; Notable for the following:    APPearance CLOUDY (*)    Ketones, ur TRACE (*)    All other components within normal limits  LIPASE, BLOOD  LACTIC ACID, PLASMA   Ct Abdomen Pelvis W Contrast  04/05/2011  *RADIOLOGY REPORT*  Clinical Data: Diffuse abdominal pain and vomiting.  CT ABDOMEN AND PELVIS WITH CONTRAST  Technique:  Multidetector CT imaging of the abdomen and pelvis was performed following the standard protocol during bolus administration of intravenous contrast.  Contrast: OMNIPAQUE IOHEXOL 300 MG/ML IV SOLN  Comparison: None  Findings: The lung bases are clear except for dependent atelectasis.  The liver is unremarkable.  No focal lesions or biliary dilatation. The gallbladder is surgically absent.  There is moderate fluid in the right upper quadrant, surrounding the liver and in the right paracolic gutter.  The spleen is normal in size.  No focal lesions. Adjacent calcifications are noted and may be related to previous trauma.  The pancreas demonstrates atrophy.  No focal lesions or inflammation.  Adrenal glands and kidneys are normal except for bilateral parapelvic cysts.  The stomach is unremarkable and the duodenum appears normal.  The proximal small bowel loops are normal.  The mid distal small bowel loops demonstrate obstructive findings with findings very suspicious for a closed loop obstruction in the right abdomen. No pneumatosis.  In the central mid abdomen there is a 19 cm mass involving the small bowel.  This mass may be causing the twisting of the mesentery and subsequent closed loop obstruction.  The colon is completely decompressed.   There is moderate free pelvic fluid.  The uterus is enlarged and there is a probable large fundal fibroid which is generated.  The ovaries appear normal.  Moderate free pelvic fluid.  The bladder is unremarkable.  A right inguinal hernia is noted containing only fat.  The aorta demonstrates tortuosity and atherosclerotic changes but no focal aneurysm.  No enlarged mesenteric or retroperitoneal lymph nodes.  Scattered nodes are noted.  The bony structures are intact.  A compression deformity of T12 is noted.  IMPRESSION:  1.  Closed loop small bowel obstruction in the right abdomen. 2.  8 x 6 cm small bowel mass in the mid abdomen.  Lymphoma, sarcoma and adenocarcinoma are considerations. 3.  Moderate abdominal and pelvic ascites. 4.  Large degenerated fundal fibroid.  Critical Value/emergent results were called by telephone at the time of interpretation on 04/05/2011  at 12:45 p.m.  to  Dr. Hyman Hopes, who verbally acknowledged these results.  Original Report Authenticated By: P. Loralie Champagne, M.D.    1. Small bowel mass   2. Bowel obstruction     MDM  The patient presents with nonspecific abdominal pain. She does appear well here. Her vital signs are stable. Differential diagnosis is broad including bowel obstruction which she has increased risk for given her history of abdominal surgeries. IV fluids, Zofran, morphine. CT abdomen pelvis, labs, UA ordered. Reassess.  1100 Patient unable to tolerate PO contrast. Vomited after one cup per nursing staff. Repeat zofran ordered.   1249 Discussed bowel mass with closed loop obstruction with surgery who will see patient in the Emergency department. Discussed with patient and family. Patient continues to appear well. Mild nausea. NGT ordered.     Forbes Cellar, MD 04/05/11 [phone]

## 2011-04-05 NOTE — ED Notes (Signed)
IV not infusing to gravity or on pump. Extremely positional site. New IV site started, bolus now infusing.

## 2011-04-05 NOTE — ED Notes (Signed)
Hyman Hopes, EDP at bedside discussing CT results and plan of care.

## 2011-04-05 NOTE — ED Notes (Signed)
MD at bedside. 

## 2011-04-06 ENCOUNTER — Other Ambulatory Visit (INDEPENDENT_AMBULATORY_CARE_PROVIDER_SITE_OTHER): Payer: Self-pay | Admitting: Surgery

## 2011-04-06 LAB — GLUCOSE, CAPILLARY

## 2011-04-06 LAB — CBC
MCV: 91.9 fL (ref 78.0–100.0)
Platelets: 314 10*3/uL (ref 150–400)
RBC: 4.42 MIL/uL (ref 3.87–5.11)
RDW: 14.5 % (ref 11.5–15.5)
WBC: 9.5 10*3/uL (ref 4.0–10.5)

## 2011-04-06 LAB — BASIC METABOLIC PANEL
CO2: 23 mEq/L (ref 19–32)
Calcium: 8.7 mg/dL (ref 8.4–10.5)
Chloride: 106 mEq/L (ref 96–112)
Creatinine, Ser: 1.05 mg/dL (ref 0.50–1.10)
GFR calc Af Amer: 52 mL/min — ABNORMAL LOW (ref >90)
Sodium: 139 mEq/L (ref 135–145)

## 2011-04-06 MED ORDER — SODIUM CHLORIDE 0.9 % IV SOLN
Freq: Once | INTRAVENOUS | Status: AC
  Administered 2011-04-06: 05:00:00 via INTRAVENOUS

## 2011-04-06 MED ORDER — HALOPERIDOL LACTATE 5 MG/ML IJ SOLN
5.0000 mg | Freq: Four times a day (QID) | INTRAMUSCULAR | Status: DC | PRN
  Administered 2011-04-08: 5 mg via INTRAMUSCULAR
  Filled 2011-04-06: qty 1

## 2011-04-06 NOTE — Progress Notes (Signed)
1 Day Post-Op  Subjective: Pleasantly confused  Objective: Vital signs in last 24 hours: Temp:  [97.6 F (36.4 C)-98.7 F (37.1 C)] 98.6 F (37 C) (01/23 0805) Pulse Rate:  [65-85] 84  (01/23 0805) Resp:  [16-22] 16  (01/23 0805) BP: (122-157)/(49-90) 148/65 mmHg (01/23 0400) SpO2:  [92 %-100 %] 93 % (01/23 0745) FiO2 (%):  [2 %] 2 % (01/23 0805) Weight:  [213 lb 13.5 oz (97 kg)-220 lb (99.791 kg)] 213 lb 13.5 oz (97 kg) (01/23 0000)    Intake/Output from previous day: 01/22 0701 - 01/23 0700 In: 5476.8 [I.V.:4776.8; IV Piggyback:700] Out: 485 [Urine:385; Blood:100] Intake/Output this shift:    General appearance: alert and cooperative Resp: clear to auscultation bilaterally Cardio: regular rate and rhythm, S1, S2 normal, no murmur, click, rub or gallop GI: abdomen soft.  dressing dry.  mild distension  Lab Results:   Basename 04/06/11 0310 04/05/11 0840  WBC 9.5 12.0*  HGB 13.5 13.5  HCT 40.6 41.3  PLT 314 349   BMET  Basename 04/06/11 0310 04/05/11 0840  NA 139 141  K 4.5 3.9  CL 106 106  CO2 23 25  GLUCOSE 130* 136*  BUN 23 18  CREATININE 1.05 0.86  CALCIUM 8.7 10.2   PT/INR No results found for this basename: LABPROT:2,INR:2 in the last 72 hours ABG No results found for this basename: PHART:2,PCO2:2,PO2:2,HCO3:2 in the last 72 hours  Studies/Results: Ct Abdomen Pelvis W Contrast  04/05/2011  *RADIOLOGY REPORT*  Clinical Data: Diffuse abdominal pain and vomiting.  CT ABDOMEN AND PELVIS WITH CONTRAST  Technique:  Multidetector CT imaging of the abdomen and pelvis was performed following the standard protocol during bolus administration of intravenous contrast.  Contrast: OMNIPAQUE IOHEXOL 300 MG/ML IV SOLN  Comparison: None  Findings: The lung bases are clear except for dependent atelectasis.  The liver is unremarkable.  No focal lesions or biliary dilatation. The gallbladder is surgically absent.  There is moderate fluid in the right upper quadrant,  surrounding the liver and in the right paracolic gutter.  The spleen is normal in size.  No focal lesions. Adjacent calcifications are noted and may be related to previous trauma.  The pancreas demonstrates atrophy.  No focal lesions or inflammation.  Adrenal glands and kidneys are normal except for bilateral parapelvic cysts.  The stomach is unremarkable and the duodenum appears normal.  The proximal small bowel loops are normal.  The mid distal small bowel loops demonstrate obstructive findings with findings very suspicious for a closed loop obstruction in the right abdomen. No pneumatosis.  In the central mid abdomen there is a 19 cm mass involving the small bowel.  This mass may be causing the twisting of the mesentery and subsequent closed loop obstruction.  The colon is completely decompressed.  There is moderate free pelvic fluid.  The uterus is enlarged and there is a probable large fundal fibroid which is generated.  The ovaries appear normal.  Moderate free pelvic fluid.  The bladder is unremarkable.  A right inguinal hernia is noted containing only fat.  The aorta demonstrates tortuosity and atherosclerotic changes but no focal aneurysm.  No enlarged mesenteric or retroperitoneal lymph nodes.  Scattered nodes are noted.  The bony structures are intact.  A compression deformity of T12 is noted.  IMPRESSION:  1.  Closed loop small bowel obstruction in the right abdomen. 2.  8 x 6 cm small bowel mass in the mid abdomen.  Lymphoma, sarcoma and adenocarcinoma are considerations.  3.  Moderate abdominal and pelvic ascites. 4.  Large degenerated fundal fibroid.  Critical Value/emergent results were called by telephone at the time of interpretation on 04/05/2011  at 12:45 p.m.  to  Dr. Hyman Hopes, who verbally acknowledged these results.  Original Report Authenticated By: P. Loralie Champagne, M.D.   Dg Chest Port 1 View  04/05/2011  *RADIOLOGY REPORT*  Clinical Data: Preop.  Abdominal surgery.  PORTABLE CHEST - 1 VIEW   Comparison: None.  Findings: Lungs are under aerated and grossly clear.  Mild cardiomegaly.  No pneumothorax.  Bilateral shoulder hemiarthroplasty.  No evidence of breakage of the hardware.  IMPRESSION: Cardiomegaly without decompensation.  Low lung volumes.  Original Report Authenticated By: Donavan Burnet, M.D.    Anti-infectives: Anti-infectives     Start     Dose/Rate Route Frequency Ordered Stop   04/05/11 2130   cefOXitin (MEFOXIN) 1 g in dextrose 5 % 50 mL IVPB        1 g 100 mL/hr over 30 Minutes Intravenous 4 times per day 04/05/11 1907 04/05/11 2200   04/05/11 1421   cefOXitin (MEFOXIN) 1 g in dextrose 5 % 50 mL IVPB  Status:  Discontinued        1 g 100 mL/hr over 30 Minutes Intravenous 60 min pre-op 04/05/11 1421 04/05/11 1827          Assessment/Plan: s/p Procedure(s): EXPLORATORY LAPAROTOMY Continue foley due to strict I&O Haldol for DELIRIUM  OOB Cont NGT  LOS: 1 day    Jeaneen Cala A. 04/06/2011

## 2011-04-06 NOTE — Progress Notes (Signed)
Spoke with Destiny Cochran, [place] regarding pt's BP. No new orders at this time. Will continue to monitor. Pt remains alert to self only. Easily aroused when sleeping.

## 2011-04-06 NOTE — Progress Notes (Signed)
CSW will assist with pt's D/C planning needs. Pt is from [facility]. It is unclear, at this time, if pt will require a higher level of care upon d/c .  CSW is available to assit with SNF placement ,if required. Message has been left for pt's daughter to contact CSW for assistance with d/c planning.

## 2011-04-07 ENCOUNTER — Encounter (HOSPITAL_COMMUNITY): Payer: Self-pay | Admitting: Surgery

## 2011-04-07 LAB — CBC
HCT: 34.1 % — ABNORMAL LOW (ref 36.0–46.0)
Hemoglobin: 11.3 g/dL — ABNORMAL LOW (ref 12.0–15.0)
MCHC: 33.1 g/dL (ref 30.0–36.0)
WBC: 9.7 10*3/uL (ref 4.0–10.5)

## 2011-04-07 LAB — COMPREHENSIVE METABOLIC PANEL
Alkaline Phosphatase: 59 U/L (ref 39–117)
BUN: 33 mg/dL — ABNORMAL HIGH (ref 6–23)
Chloride: 107 mEq/L (ref 96–112)
GFR calc Af Amer: 36 mL/min — ABNORMAL LOW (ref >90)
Glucose, Bld: 122 mg/dL — ABNORMAL HIGH (ref 70–99)
Potassium: 4.8 mEq/L (ref 3.5–5.1)
Total Bilirubin: 0.3 mg/dL (ref 0.3–1.2)

## 2011-04-07 LAB — BASIC METABOLIC PANEL
Calcium: 8 mg/dL — ABNORMAL LOW (ref 8.4–10.5)
GFR calc non Af Amer: 33 mL/min — ABNORMAL LOW (ref >90)
Sodium: 135 mEq/L (ref 135–145)

## 2011-04-07 MED ORDER — SODIUM CHLORIDE 0.9 % IV BOLUS (SEPSIS)
500.0000 mL | Freq: Once | INTRAVENOUS | Status: AC
  Administered 2011-04-07: 500 mL via INTRAVENOUS

## 2011-04-07 MED ORDER — DEXTROSE-NACL 5-0.45 % IV SOLN
INTRAVENOUS | Status: DC
  Administered 2011-04-07 – 2011-04-14 (×10): via INTRAVENOUS

## 2011-04-07 MED ORDER — HYDROMORPHONE HCL PF 1 MG/ML IJ SOLN
0.5000 mg | INTRAMUSCULAR | Status: DC | PRN
  Administered 2011-04-07 (×5): 0.5 mg via INTRAVENOUS
  Filled 2011-04-07 (×5): qty 1

## 2011-04-07 NOTE — Progress Notes (Signed)
2 Days Post-Op  Subjective:  PT CONFUSED. Pulled out NGT. Objective: Vital signs in last 24 hours: Temp:  [98.5 F (36.9 C)-99.9 F (37.7 C)] 98.9 F (37.2 C) (01/24 0400) Pulse Rate:  [40-84] 40  (01/24 0305) Resp:  [12-21] 12  (01/24 0500) BP: (122-147)/(38-61) 130/42 mmHg (01/24 0305) SpO2:  [95 %-100 %] 96 % (01/24 0500) FiO2 (%):  [2 %] 2 % (01/23 0805) Weight:  [220 lb 3.8 oz (99.9 kg)] 220 lb 3.8 oz (99.9 kg) (01/24 0500) Last BM Date:  (PTA)  Intake/Output from previous day: 01/23 0701 - 01/24 0700 In: 2100.6 [I.V.:1725.6; NG/GT:375] Out: 930 [Urine:730; Emesis/NG output:200] Intake/Output this shift:    General appearance: cooperative Resp: rhonchi bilaterally Cardio: regular rate and rhythm, S1, S2 normal, no murmur, click, rub or gallop GI: WOUND CLEAN.  SOFT nd ABD.  Lab Results:   Basename 04/07/11 0300 04/06/11 0310  WBC 9.7 9.5  HGB 11.3* 13.5  HCT 34.1* 40.6  PLT 263 314   BMET  Basename 04/07/11 0500 04/07/11 0300  NA 135 134*  K 4.9 4.8  CL 107 107  CO2 22 23  GLUCOSE 124* 122*  BUN 34* 33*  CREATININE 1.35* 1.42*  CALCIUM 8.0* 8.1*   PT/INR No results found for this basename: LABPROT:2,INR:2 in the last 72 hours ABG No results found for this basename: PHART:2,PCO2:2,PO2:2,HCO3:2 in the last 72 hours  Studies/Results: Ct Abdomen Pelvis W Contrast  04/05/2011  *RADIOLOGY REPORT*  Clinical Data: Diffuse abdominal pain and vomiting.  CT ABDOMEN AND PELVIS WITH CONTRAST  Technique:  Multidetector CT imaging of the abdomen and pelvis was performed following the standard protocol during bolus administration of intravenous contrast.  Contrast: OMNIPAQUE IOHEXOL 300 MG/ML IV SOLN  Comparison: None  Findings: The lung bases are clear except for dependent atelectasis.  The liver is unremarkable.  No focal lesions or biliary dilatation. The gallbladder is surgically absent.  There is moderate fluid in the right upper quadrant, surrounding the liver  and in the right paracolic gutter.  The spleen is normal in size.  No focal lesions. Adjacent calcifications are noted and may be related to previous trauma.  The pancreas demonstrates atrophy.  No focal lesions or inflammation.  Adrenal glands and kidneys are normal except for bilateral parapelvic cysts.  The stomach is unremarkable and the duodenum appears normal.  The proximal small bowel loops are normal.  The mid distal small bowel loops demonstrate obstructive findings with findings very suspicious for a closed loop obstruction in the right abdomen. No pneumatosis.  In the central mid abdomen there is a 19 cm mass involving the small bowel.  This mass may be causing the twisting of the mesentery and subsequent closed loop obstruction.  The colon is completely decompressed.  There is moderate free pelvic fluid.  The uterus is enlarged and there is a probable large fundal fibroid which is generated.  The ovaries appear normal.  Moderate free pelvic fluid.  The bladder is unremarkable.  A right inguinal hernia is noted containing only fat.  The aorta demonstrates tortuosity and atherosclerotic changes but no focal aneurysm.  No enlarged mesenteric or retroperitoneal lymph nodes.  Scattered nodes are noted.  The bony structures are intact.  A compression deformity of T12 is noted.  IMPRESSION:  1.  Closed loop small bowel obstruction in the right abdomen. 2.  8 x 6 cm small bowel mass in the mid abdomen.  Lymphoma, sarcoma and adenocarcinoma are considerations. 3.  Moderate  abdominal and pelvic ascites. 4.  Large degenerated fundal fibroid.  Critical Value/emergent results were called by telephone at the time of interpretation on 04/05/2011  at 12:45 p.m.  to  Dr. Hyman Hopes, who verbally acknowledged these results.  Original Report Authenticated By: P. Loralie Champagne, M.D.   Dg Chest Port 1 View  04/05/2011  *RADIOLOGY REPORT*  Clinical Data: Preop.  Abdominal surgery.  PORTABLE CHEST - 1 VIEW  Comparison: None.   Findings: Lungs are under aerated and grossly clear.  Mild cardiomegaly.  No pneumothorax.  Bilateral shoulder hemiarthroplasty.  No evidence of breakage of the hardware.  IMPRESSION: Cardiomegaly without decompensation.  Low lung volumes.  Original Report Authenticated By: Donavan Burnet, M.D.    Anti-infectives: Anti-infectives     Start     Dose/Rate Route Frequency Ordered Stop   04/05/11 2130   cefOXitin (MEFOXIN) 1 g in dextrose 5 % 50 mL IVPB        1 g 100 mL/hr over 30 Minutes Intravenous 4 times per day 04/05/11 1907 04/05/11 2200   04/05/11 1421   cefOXitin (MEFOXIN) 1 g in dextrose 5 % 50 mL IVPB  Status:  Discontinued        1 g 100 mL/hr over 30 Minutes Intravenous 60 min pre-op 04/05/11 1421 04/05/11 1827          Assessment/Plan: s/p Procedure(s): EXPLORATORY LAPAROTOMY Keep NGT OUT.  PT/OT consult.  Wet to dry dressing shanges bid.  LOS: 2 days    Ashaz Robling A. 04/07/2011

## 2011-04-07 NOTE — Progress Notes (Signed)
Spoke with Cornett MD. Pt remains confused. Non restraint mittens taken off by patient and NG out. NG put out 200cc bilious drainage. Orders to leave NG out for now. I will pass this along to day RN to continue to monitor.

## 2011-04-08 NOTE — Progress Notes (Signed)
Patient ID: Destiny Cochran, female   DOB: [birth year], 78 y.o.   MRN: [id number] 3 Days Post-Op  Subjective: Pt confused.  Does not know she is in the hospital.  Is unaware that she had surgery.  C/o some pain  Objective: Vital signs in last 24 hours: Temp:  [98.5 F (36.9 C)-100.1 F (37.8 C)] 98.8 F (37.1 C) (01/25 0800) Pulse Rate:  [39-81] 39  (01/25 0350) Resp:  [16-23] 20  (01/25 0735) BP: (118-153)/(39-72) 143/52 mmHg (01/25 0735) SpO2:  [91 %-99 %] 98 % (01/25 0735) Weight:  [218 lb 14.7 oz (99.3 kg)] 218 lb 14.7 oz (99.3 kg) (01/25 0300) Last BM Date:  (PTA, unknown)  Intake/Output from previous day: 01/24 0701 - 01/25 0700 In: 2401.7 [I.V.:2401.7] Out: 685 [Urine:685] Intake/Output this shift: Total I/O In: -  Out: 150 [Urine:150]  PE: Abd: soft, tender, few BS, ND, incisional wound is clean and packed. Heart: regular with occasional PVCs Lungs: CTAB  Lab Results:   Basename 04/07/11 0300 04/06/11 0310  WBC 9.7 9.5  HGB 11.3* 13.5  HCT 34.1* 40.6  PLT 263 314   BMET  Basename 04/07/11 0500 04/07/11 0300  NA 135 134*  K 4.9 4.8  CL 107 107  CO2 22 23  GLUCOSE 124* 122*  BUN 34* 33*  CREATININE 1.35* 1.42*  CALCIUM 8.0* 8.1*   PT/INR No results found for this basename: LABPROT:2,INR:2 in the last 72 hours   Studies/Results: No results found.  Anti-infectives: Anti-infectives     Start     Dose/Rate Route Frequency Ordered Stop   04/05/11 2130   cefOXitin (MEFOXIN) 1 g in dextrose 5 % 50 mL IVPB        1 g 100 mL/hr over 30 Minutes Intravenous 4 times per day 04/05/11 1907 04/05/11 2200   04/05/11 1421   cefOXitin (MEFOXIN) 1 g in dextrose 5 % 50 mL IVPB  Status:  Discontinued        1 g 100 mL/hr over 30 Minutes Intravenous 60 min pre-op 04/05/11 1421 04/05/11 1827           Assessment/Plan  1. S/p ex lap with SBR x2 for closed loop SBO secondary to SB mass 2. Confusion post-operatively  Plan: 1. Will leave foley in place for now  secondary to deconditioning and  Inability to get up to commode. 2. PT/OT eval pending 3. Will give ice chips as she does have a few BS 4. Ok to transfer to tele floor  LOS: 3 days    Tammy Ericsson E 04/08/2011

## 2011-04-08 NOTE — Progress Notes (Signed)
CSW following pt to assist with D/C planning. Spoke with pt's daughter 1/24 to discus d/c options.  PT is pending.  Will review recommendations once available and complete FL2 ( ? SNF needed ).

## 2011-04-08 NOTE — Progress Notes (Signed)
Spoke with DR. T. Cornett regarding pt runs of bigeminy, MD verbalize okay and he is aware, states pt been having runs of bigeminy since surgery. Pt without any s/s of distress. V/s stable.

## 2011-04-08 NOTE — Evaluation (Signed)
Physical Therapy Evaluation Patient Details Name: Destiny Cochran MRN: [id number] DOB: 08/23/1916 Today's Date: 04/08/2011  Problem List:  Patient Active Problem List  Diagnoses  . SBO (small bowel obstruction)  . Small bowel tumor    Past Medical History:  Past Medical History  Diagnosis Date  . Osteoporosis   . Constipation    Past Surgical History:  Past Surgical History  Procedure Date  . Cholecystectomy 1975  . Bilateral total shoulder arthroplasties   . Cataract extraction, bilateral   . Laparotomy 04/05/2011    Procedure: EXPLORATORY LAPAROTOMY;  Surgeon: Clovis Pu. Cornett, MD;  Location: [facility];  Service: General;  Laterality: N/A;  Cecectomy, small bowel resection    PT Assessment/Plan/Recommendation PT Assessment Clinical Impression Statement: Patient with SBO s/p exp lap with abd wound presents with decreased independence with mobility with acute pain, decreased LE strength, decreased activity tolerance and will benefit from skilled PT to maximize independence to allow eventual return to ALF after SNF stay. PT Recommendation/Assessment: Patient will need skilled PT in the acute care venue PT Problem List: Decreased strength;Decreased mobility;Decreased activity tolerance;Pain PT Therapy Diagnosis : Abnormality of gait;Generalized weakness;Acute pain PT Plan PT Frequency: Min 3X/week PT Treatment/Interventions: DME instruction;Gait training;Functional mobility training;Therapeutic activities;Therapeutic exercise;Patient/family education PT Recommendation Follow Up Recommendations: Skilled nursing facility Equipment Recommended: Defer to next venue PT Goals  Acute Rehab PT Goals PT Goal Formulation: Patient unable to participate in goal setting Time For Goal Achievement: 2 weeks Pt will Roll Supine to Right Side: with min assist;with rail PT Goal: Rolling Supine to Right Side - Progress: Goal set today Pt will go Supine/Side to Sit: with min assist;with rail PT Goal:  Supine/Side to Sit - Progress: Goal set today Pt will go Sit to Supine/Side: with mod assist;with rail PT Goal: Sit to Supine/Side - Progress: Goal set today Pt will go Sit to Stand: with supervision;with upper extremity assist PT Goal: Sit to Stand - Progress: Goal set today Pt will go Stand to Sit: with supervision;with upper extremity assist PT Goal: Stand to Sit - Progress: Goal set today Pt will Ambulate: 51 - 150 feet;with min assist;with rolling walker PT Goal: Ambulate - Progress: Goal set today  PT Evaluation Precautions/Restrictions  Precautions Precautions: Fall Precaution Comments: abdominal wound Prior Functioning  Home Living Receives Help From: Other (Comment) (staff at ALF) Type of Home: Assisted living Home Adaptive Equipment: Walker - rolling Additional Comments: Pt lethargic, confused, no family present. Prior Function Level of Independence: Independent with basic ADLs;Requires assistive device for independence Comments: Pt lethargic, confused, no family present. Cognition Cognition Arousal/Alertness: Awake/alert Overall Cognitive Status: Impaired Memory: Appears impaired Memory Deficits: STM impairments. Orientation Level: Oriented to person;Disoriented to time;Disoriented to place;Disoriented to situation Following Commands: Follows one step commands inconsistently Safety/Judgement: Decreased awareness of safety precautions Decreased Safety/Judgement: Decreased awareness of need for assistance Safety/Judgement - Other Comments: Pt impulsive, pulled out NG tube per nursing, very slow to process. Sensation/Coordination   Extremity AssessmentL RLE Assessment RLE Assessment: Exceptions to [facility] RLE AROM (degrees) Overall AROM Right Lower Extremity: Within functional limits for tasks assessed RLE Strength RLE Overall Strength Comments: knee extension 4+/5, hip flexion 3+/4, ankle dorsiflexion 4/5 LLE Assessment LLE Assessment: Exceptions to [facility] LLE AROM  (degrees) Overall AROM Left Lower Extremity: Within functional limits for tasks assessed LLE Strength LLE Overall Strength Comments: knee extension 4+/5, hip flexion 3+/4, ankle dorsiflexion 4/5 Mobility (including Balance) Bed Mobility Bed Mobility: Yes Supine to Sit: 1: +2 Total assist;With rails;HOB elevated (Comment degrees)  Supine to Sit Details (indicate cue type and reason): pt=25%, cues for technique Transfers Sit to Stand: 3: Mod assist;From bed;With upper extremity assist Sit to Stand Details (indicate cue type and reason): +2 for lines Stand to Sit: 3: Mod assist;With upper extremity assist;With armrests;To chair/3-in-1 Stand to Sit Details: cues for hand placement and technique, +2 for lines and safety Stand Pivot Transfers: 3: Mod assist Stand Pivot Transfer Details (indicate cue type and reason): with rolling walker bed to chair with cues and +2 for safety    Exercise    End of Session PT - End of Session Equipment Utilized During Treatment: Gait belt Activity Tolerance: Patient limited by pain Patient left: in chair;with call bell in reach (mitts on hands) General Behavior During Session: [facility] for tasks performed Cognition: Impaired  Severin Bou,CYNDI 04/08/2011, 2:36 PM

## 2011-04-08 NOTE — Progress Notes (Signed)
Pt confused, restless, pulled iv out to rt hand, removed one of her hand mittens, iv restarted to lt ant forearm, haldol 5mg  IM given as ordered. Bed alarm on, mitten to both arms. No s/s of acute distress.

## 2011-04-08 NOTE — Progress Notes (Signed)
Confused. Leave foley in place for I and O STRICT.

## 2011-04-08 NOTE — Evaluation (Signed)
Occupational Therapy Evaluation Patient Details Name: Destiny Cochran MRN: [id number] DOB: 1916/06/24 Today's Date: 04/08/2011 EV2 1130-1200 Problem List:  Patient Active Problem List  Diagnoses  . SBO (small bowel obstruction)  . Small bowel tumor    Past Medical History:  Past Medical History  Diagnosis Date  . Osteoporosis   . Constipation    Past Surgical History:  Past Surgical History  Procedure Date  . Cholecystectomy 1975  . Bilateral total shoulder arthroplasties   . Cataract extraction, bilateral   . Laparotomy 04/05/2011    Procedure: EXPLORATORY LAPAROTOMY;  Surgeon: Clovis Pu. Cornett, MD;  Location: [facility];  Service: General;  Laterality: N/A;  Cecectomy, small bowel resection    OT Assessment/Plan/Recommendation OT Assessment Clinical Impression Statement: 78 y/o female who presents w/SBO s/p surgical repair. Recommend skilled OT to maximize I w/BADLs to min A/supervision level in prep for d/c to next venue of care. OT Recommendation/Assessment: Patient will need skilled OT in the acute care venue OT Problem List: Decreased activity tolerance;Decreased cognition;Decreased safety awareness;Decreased knowledge of use of DME or AE;Decreased knowledge of precautions;Pain Barriers to Discharge: Inaccessible home environment;Decreased caregiver support OT Goals Acute Rehab OT Goals OT Goal Formulation: Patient unable to participate in goal setting ADL Goals Pt Will Perform Grooming: with supervision;Standing at sink (X 2 tasks to improve standing activity tolerance.) ADL Goal: Grooming - Progress: Goal set today Pt Will Transfer to Toilet: with supervision;3-in-1;Stand pivot transfer ADL Goal: Toilet Transfer - Progress: Goal set today Pt Will Perform Toileting - Clothing Manipulation: with min assist;Standing ADL Goal: Toileting - Clothing Manipulation - Progress: Goal set today Pt Will Perform Toileting - Hygiene: with min assist;Sit to stand from 3-in-1/toilet ADL  Goal: Toileting - Hygiene - Progress: Goal set today  OT Evaluation Precautions/Restrictions  Precautions Precautions: Fall Restrictions Weight Bearing Restrictions: No Prior Functioning Home Living Receives Help From: Other (Comment) (staff at ALF) Type of Home: Assisted living Home Adaptive Equipment: Walker - rolling Additional Comments: Pt lethargic, confused, no family present. Prior Function Level of Independence: Independent with basic ADLs;Requires assistive device for independence Comments: Pt lethargic, confused, no family present. ADL ADL Grooming: Performed;Teeth care;Minimal assistance Where Assessed - Grooming: Sitting, chair;Supported Location manager Bathing: Simulated;Minimal assistance Where Assessed - Upper Body Bathing: Sitting, bed;Unsupported Lower Body Bathing: Simulated;+1 Total assistance Where Assessed - Lower Body Bathing: Sit to stand from bed Upper Body Dressing: Simulated;Minimal assistance Where Assessed - Upper Body Dressing: Sitting, bed;Unsupported Lower Body Dressing: Simulated;+1 Total assistance Where Assessed - Lower Body Dressing: Sit to stand from bed Toilet Transfer: Simulated;Moderate assistance;Other (comment) (+2 for safety with lines.) Toilet Transfer Details (indicate cue type and reason): cues for hand placement, technique. Toilet Transfer Method: Surveyor, minerals: Other (comment) (sim to chair.) Toileting - Clothing Manipulation: Simulated;+1 Total assistance Where Assessed - Glass blower/designer Manipulation: Standing Toileting - Hygiene: Simulated;+1 Total assistance Where Assessed - Toileting Hygiene: Standing Tub/Shower Transfer: Not assessed Tub/Shower Transfer Method: Not assessed Equipment Used: Rolling walker Ambulation Related to ADLs: Pt lethargic, confused, reapplied restraint mittens when through. Vision/Perception    Cognition Cognition Arousal/Alertness: Lethargic Overall Cognitive Status:  Impaired Memory: Appears impaired Memory Deficits: STM impairments. Orientation Level: Oriented to person;Disoriented to time;Disoriented to place;Disoriented to situation Following Commands: Follows one step commands inconsistently Safety/Judgement: Decreased awareness of safety precautions Decreased Safety/Judgement: Decreased awareness of need for assistance Safety/Judgement - Other Comments: Pt impulsive, pulled out NG tube per nursing, very slow to process. Sensation/Coordination   Extremity Assessment RUE Assessment RUE  Assessment: Exceptions to [facility] RUE AROM (degrees) Overall AROM Right Upper Extremity: Deficits;Due to premorbid status (~90 degrees of shoulder flexion) RUE Strength RUE Overall Strength: Within Functional Limits for tasks performed LUE Assessment LUE Assessment: Exceptions to [facility] LUE AROM (degrees) Overall AROM Left Upper Extremity: Deficits;Due to premorbid status;Other (comment) (~90 degrees shoulder flexion) LUE Strength LUE Overall Strength: Within Functional Limits for tasks assessed Mobility  Bed Mobility Bed Mobility: Yes Supine to Sit: 1: +2 Total assist;HOB elevated (Comment degrees);With rails (Pt 25% A needed for LEs and trunk for log roll.) Supine to Sit Details (indicate cue type and reason): max cues for technique. Transfers Transfers: Yes Sit to Stand: 3: Mod assist;From bed (+2 A for lines, safety) Stand to Sit: 3: Mod assist;With upper extremity assist;To chair/3-in-1;With armrests (+2 A for lines, safety) Exercises   End of Session OT - End of Session Equipment Utilized During Treatment: Gait belt;Other (comment) (RW) Activity Tolerance: Patient limited by fatigue;Patient limited by pain Patient left: in chair;with call bell in reach;with restraints reapplied Nurse Communication: Mobility status for transfers General Behavior During Session: Lethargic Cognition: Impaired   Urijah Arko A, OTR/L [id number] 04/08/2011, 12:43 PM

## 2011-04-09 MED ORDER — HYDROCODONE-ACETAMINOPHEN 10-325 MG PO TABS
1.0000 | ORAL_TABLET | ORAL | Status: DC | PRN
  Administered 2011-04-09 – 2011-04-13 (×6): 1 via ORAL
  Filled 2011-04-09 (×6): qty 1

## 2011-04-09 MED ORDER — VITAMINS A & D EX OINT
TOPICAL_OINTMENT | CUTANEOUS | Status: AC
  Filled 2011-04-09: qty 5

## 2011-04-09 NOTE — Progress Notes (Signed)
Patient ID: Destiny Cochran, female   DOB: 07-Mar-1917, 78 y.o.   MRN: [id number] 4 Days Post-Op  Subjective: Mildly confused but alert. Denies nausea. Had BM.  Some incisional pain  Objective: Vital signs in last 24 hours: Temp:  [97.4 F (36.3 C)-99.8 F (37.7 C)] 97.4 F (36.3 C) (01/26 0531) Pulse Rate:  [38-88] 46  (01/26 0836) Resp:  [16-20] 16  (01/26 0531) BP: (148-174)/(66-76) 150/73 mmHg (01/26 0531) SpO2:  [95 %-99 %] 98 % (01/26 0531) FiO2 (%):  [2.5 %] 2.5 % (01/26 0531) Last BM Date:  (PTA, unknown)  Intake/Output from previous day: 01/25 0701 - 01/26 0700 In: 2680 [P.O.:480; I.V.:2200] Out: 1500 [Urine:1500] Intake/Output this shift:    General appearance: alert, cooperative and no distress, mild confusion, oriented to person only, appropriate conversation GI: normal findings: soft, non-tender Incision/Wound:Clean dressing, did not remove  Lab Results:   Basename 04/07/11 0300  WBC 9.7  HGB 11.3*  HCT 34.1*  PLT 263   BMET  Basename 04/07/11 0500 04/07/11 0300  NA 135 134*  K 4.9 4.8  CL 107 107  CO2 22 23  GLUCOSE 124* 122*  BUN 34* 33*  CREATININE 1.35* 1.42*  CALCIUM 8.0* 8.1*   PT/INR No results found for this basename: LABPROT:2,INR:2 in the last 72 hours ABG No results found for this basename: PHART:2,PCO2:2,PO2:2,HCO3:2 in the last 72 hours  Studies/Results: No results found.  Anti-infectives: Anti-infectives     Start     Dose/Rate Route Frequency Ordered Stop   04/05/11 2130   cefOXitin (MEFOXIN) 1 g in dextrose 5 % 50 mL IVPB        1 g 100 mL/hr over 30 Minutes Intravenous 4 times per day 04/05/11 1907 04/05/11 2200   04/05/11 1421   cefOXitin (MEFOXIN) 1 g in dextrose 5 % 50 mL IVPB  Status:  Discontinued        1 g 100 mL/hr over 30 Minutes Intravenous 60 min pre-op 04/05/11 1421 04/05/11 1827          Assessment/Plan: s/p Procedure(s): EXPLORATORY LAPAROTOMY, SB resection Stable post op Start CL diet.  PT/OT ordered.   Check lab AM    LOS: 4 days    Thessaly Mccullers T 04/09/2011

## 2011-04-10 LAB — BASIC METABOLIC PANEL
Calcium: 8.1 mg/dL — ABNORMAL LOW (ref 8.4–10.5)
GFR calc non Af Amer: 71 mL/min — ABNORMAL LOW (ref >90)
Sodium: 137 mEq/L (ref 135–145)

## 2011-04-10 LAB — CBC
MCH: 29.7 pg (ref 26.0–34.0)
MCHC: 32.6 g/dL (ref 30.0–36.0)
Platelets: 343 10*3/uL (ref 150–400)
RBC: 3.94 MIL/uL (ref 3.87–5.11)

## 2011-04-10 NOTE — Progress Notes (Signed)
Patient ID: Destiny Cochran, female   DOB: [birth year], 78 y.o.   MRN: [id number] 5 Days Post-Op  Subjective: Mild pain, denies nausea  Objective: Vital signs in last 24 hours: Temp:  [97.9 F (36.6 C)-98.8 F (37.1 C)] 97.9 F (36.6 C) (01/27 0530) Pulse Rate:  [61-83] 76  (01/27 0530) Resp:  [18-22] 18  (01/27 0530) BP: (147-172)/(63-90) 147/71 mmHg (01/27 0530) SpO2:  [95 %-99 %] 95 % (01/27 0530) Last BM Date: 04/08/11  Intake/Output from previous day: 01/26 0701 - 01/27 0700 In: 10114.9 [P.O.:120; I.V.:9994.9] Out: 1650 [Urine:1650] Intake/Output this shift:    General appearance: alert, cooperative and no distress, confused but appropriate Resp: clear to auscultation bilaterally GI: normal findings: soft, non-tender Incision/Wound:Dressing just changed, clean and dry  Lab Results:   [facility] 04/10/11 0557  WBC 8.8  HGB 11.7*  HCT 35.9*  PLT 343   BMET  Basename 04/10/11 0557  NA 137  K 3.5  CL 107  CO2 25  GLUCOSE 126*  BUN 13  CREATININE 0.75  CALCIUM 8.1*   PT/INR No results found for this basename: LABPROT:2,INR:2 in the last 72 hours ABG No results found for this basename: PHART:2,PCO2:2,PO2:2,HCO3:2 in the last 72 hours  Studies/Results: No results found.  Anti-infectives: Anti-infectives     Start     Dose/Rate Route Frequency Ordered Stop   04/05/11 2130   cefOXitin (MEFOXIN) 1 g in dextrose 5 % 50 mL IVPB        1 g 100 mL/hr over 30 Minutes Intravenous 4 times per day 04/05/11 1907 04/05/11 2200   04/05/11 1421   cefOXitin (MEFOXIN) 1 g in dextrose 5 % 50 mL IVPB  Status:  Discontinued        1 g 100 mL/hr over 30 Minutes Intravenous 60 min pre-op 04/05/11 1421 04/05/11 1827          Assessment/Plan: s/p Procedure(s): EXPLORATORY LAPAROTOMY Progressing post op Advance to FL diet Continue OT/PT   LOS: 5 days    Brynlee Pennywell T 04/10/2011

## 2011-04-11 ENCOUNTER — Inpatient Hospital Stay (HOSPITAL_COMMUNITY): Payer: Medicare Other

## 2011-04-11 LAB — CBC
Platelets: 390 10*3/uL (ref 150–400)
RBC: 4.23 MIL/uL (ref 3.87–5.11)
WBC: 10.6 10*3/uL — ABNORMAL HIGH (ref 4.0–10.5)

## 2011-04-11 LAB — BASIC METABOLIC PANEL
CO2: 24 mEq/L (ref 19–32)
Calcium: 8.1 mg/dL — ABNORMAL LOW (ref 8.4–10.5)
Chloride: 104 mEq/L (ref 96–112)
Sodium: 136 mEq/L (ref 135–145)

## 2011-04-11 MED ORDER — POTASSIUM CHLORIDE 10 MEQ/100ML IV SOLN
10.0000 meq | INTRAVENOUS | Status: AC
  Administered 2011-04-11 (×3): 10 meq via INTRAVENOUS
  Filled 2011-04-11 (×3): qty 100

## 2011-04-11 NOTE — Progress Notes (Signed)
FL2 in shadow chart for MD signature. Pt has recommended SNF placement and family is agreeable with this plan. Pt has beed faxed out to [facility] . Will contact daughter with bed offers this afternoon.

## 2011-04-11 NOTE — Progress Notes (Signed)
Patient was found with green emesis on her gown and face at approximately 0500. Pt was easily aroused and responded appropriately. Lungs sounds were clear and bowels sounds hypoactive. No indication of possible aspiration. Patient was bathed, vital signs stable. Nursing will continue to monitor the patient throughout the shift.

## 2011-04-11 NOTE — Progress Notes (Signed)
6 Days Post-Op  Subjective: Pt with large amount bilious emesis early this am. She seems somewhat confused about it. She also had large loose BM as well. She denies much pain.  Objective: Vital signs in last 24 hours: Temp:  [98.1 F (36.7 C)-99.5 F (37.5 C)] 99.5 F (37.5 C) (01/28 0534) Pulse Rate:  [64-87] 87  (01/28 0534) Resp:  [18] 18  (01/28 0534) BP: (104-144)/(53-74) 144/74 mmHg (01/28 0534) SpO2:  [93 %-95 %] 95 % (01/28 0534) Last BM Date: 04/11/11  Intake/Output this shift:    Physical Exam: BP 144/74  Pulse 87  Temp(Src) 99.5 F (37.5 C) (Oral)  Resp 18  Ht 5\' 6"  (1.676 m)  Wt 99.3 kg (218 lb 14.7 oz)  BMI 35.33 kg/m2  SpO2 95% Lungs: CTA without w/r/r Heart: Regular Abdomen: soft, mildly tender, appropriately tender   Wound clean, pink, no drainage or odor. Ext: No edema or tenderness   Labs: CBC  Basename 04/10/11 0557  WBC 8.8  HGB 11.7*  HCT 35.9*  PLT 343   BMET  Basename 04/10/11 0557  NA 137  K 3.5  CL 107  CO2 25  GLUCOSE 126*  BUN 13  CREATININE 0.75  CALCIUM 8.1*   LFT No results found for this basename: PROT,ALBUMIN,AST,ALT,ALKPHOS,BILITOT,BILIDIR,IBILI,LIPASE in the last 72 hours PT/INR No results found for this basename: LABPROT:2,INR:2 in the last 72 hours ABG No results found for this basename: PHART:2,PCO2:2,PO2:2,HCO3:2 in the last 72 hours  Studies/Results: No results found.  Assessment: Principal Problem:  *SBO (small bowel obstruction) Active Problems:  Small bowel tumor   Procedure(s): EXPLORATORY LAPAROTOMY, Partial (R)colectomy, and partial SBR  Plan: Will back diet down to clears and observe for more N/V Check labs today.  LOS: 6 days    Alyse Low 04/11/2011

## 2011-04-11 NOTE — Progress Notes (Signed)
Patient examined. Her abdomen is soft and nondistended. Observe closely.

## 2011-04-11 NOTE — Progress Notes (Signed)
Physical Therapy Treatment Patient Details Name: Destiny Cochran MRN: [id number] DOB: 26-Aug-1916 Today's Date: 04/11/2011  PT Assessment/Plan  PT - Assessment/Plan Comments on Treatment Session: Patient soiled in stool in bed.  Nurse techs assisted to clean just before OOB and patient already fatigued.  RN reports patient with N&V twice this am.  PT Plan: Discharge plan remains appropriate PT Frequency: Min 3X/week Follow Up Recommendations: Skilled nursing facility Equipment Recommended: Defer to next venue PT Goals  Acute Rehab PT Goals Pt will Roll Supine to Right Side: with min assist PT Goal: Rolling Supine to Right Side - Progress: Not progressing (due to pain in abdomen) Pt will go Supine/Side to Sit: with min assist PT Goal: Supine/Side to Sit - Progress: Not progressing (due to pain in abdomen) Pt will go Sit to Stand: with supervision PT Goal: Sit to Stand - Progress: Progressing toward goal Pt will go Stand to Sit: with supervision PT Goal: Stand to Sit - Progress: Progressing toward goal  PT Treatment Precautions/Restrictions  Precautions Precautions: Fall Precaution Comments: abdominal wound Restrictions Weight Bearing Restrictions: No Mobility (including Balance) Bed Mobility Supine to Sit: 1: +2 Total assist Supine to Sit Details (indicate cue type and reason): pt=30%, pain with rolling despite cues to patient for splinting with pillow, needed help to get feet off bed and to sit upright Transfers Sit to Stand: From bed;1: +2 Total assist Sit to Stand Details (indicate cue type and reason): pt=60% with bed slightly elevated and walker in front Stand to Sit: To chair/3-in-1;With upper extremity assist;With armrests;1: +2 Total assist Stand to Sit Details: mod cues and increased time to reach for armrests, pt=50-60% Stand Pivot Transfers: 1: +2 Total assist Stand Pivot Transfer Details (indicate cue type and reason): pt=70% with rolling  walker Ambulation/Gait Ambulation/Gait: No (fatigued with OOB to chair only, HR 72, SpO2 96%)    Exercise    End of Session PT - End of Session Equipment Utilized During Treatment: Gait belt Activity Tolerance: Patient limited by fatigue Patient left: with call bell in reach;in chair Nurse Communication: Other (comment) (need for pain meds) General Behavior During Session: [facility] for tasks performed Cognition: [facility] for tasks performed  [facility] 04/11/2011, 1:40 PM

## 2011-04-12 LAB — BASIC METABOLIC PANEL
CO2: 21 mEq/L (ref 19–32)
GFR calc non Af Amer: 51 mL/min — ABNORMAL LOW (ref >90)
Glucose, Bld: 122 mg/dL — ABNORMAL HIGH (ref 70–99)
Potassium: 3.8 mEq/L (ref 3.5–5.1)
Sodium: 135 mEq/L (ref 135–145)

## 2011-04-12 NOTE — Progress Notes (Signed)
7 Days Post-Op  Subjective: Pt ok. Pleasantly confused. Denies much pain No more N/V and had BM this am.  Objective: Vital signs in last 24 hours: Temp:  [97.3 F (36.3 C)-98.3 F (36.8 C)] 98.3 F (36.8 C) (01/29 0545) Pulse Rate:  [64-88] 88  (01/29 0545) Resp:  [18] 18  (01/29 0545) BP: (106-148)/(44-82) 148/82 mmHg (01/29 0545) SpO2:  [94 %-98 %] 94 % (01/29 0545) Last BM Date: 04/12/11  Intake/Output this shift: Total I/O In: -  Out: 1 [Stool:1]  Physical Exam: BP 148/82  Pulse 88  Temp(Src) 98.3 F (36.8 C) (Oral)  Resp 18  Ht 5\' 6"  (1.676 m)  Wt 99.3 kg (218 lb 14.7 oz)  BMI 35.33 kg/m2  SpO2 94% Abdomen: soft, ND, NT Wound clean, fascia intact, no purulence, odor  Labs: CBC  Basename 04/11/11 0923 04/10/11 0557  WBC 10.6* 8.8  HGB 12.7 11.7*  HCT 38.5 35.9*  PLT 390 343   BMET  Basename 04/12/11 0452 04/11/11 0923  NA 135 136  K 3.8 3.4*  CL 106 104  CO2 21 24  GLUCOSE 122* 115*  BUN 20 15  CREATININE 0.94 0.83  CALCIUM 8.2* 8.1*   LFT No results found for this basename: PROT,ALBUMIN,AST,ALT,ALKPHOS,BILITOT,BILIDIR,IBILI,LIPASE in the last 72 hours PT/INR No results found for this basename: LABPROT:2,INR:2 in the last 72 hours ABG No results found for this basename: PHART:2,PCO2:2,PO2:2,HCO3:2 in the last 72 hours  Studies/Results: Dg Abd 1 View  04/11/2011  *RADIOLOGY REPORT*  Clinical Data: Nausea and vomiting.  Postop ileus.  ABDOMEN - 1 VIEW  Comparison: 04/05/2011  Findings: Distended loops of small and large bowel are scattered across abdomen.  No obvious free intraperitoneal gas on this supine patient.  Osteopenia and lumbar dextroscoliosis are stable.  Severe degenerative change of the hip joints.  IMPRESSION: Mild ileus pattern.  Original Report Authenticated By: Donavan Burnet, M.D.    Assessment: Principal Problem:  *SBO (small bowel obstruction) Active Problems:  Small bowel tumor Mild ileus-seems to be improving  Procedure(s): EXPLORATORY LAPAROTOMY  Plan: Advance diet again CSW to begin DC planning for return to SNF  LOS: 7 days    Destiny Cochran 04/12/2011

## 2011-04-12 NOTE — Progress Notes (Signed)
Patient interviewed and examined, agree with PA note above.  Mariella Saa MD, FACS  04/12/2011 12:45 PM

## 2011-04-12 NOTE — Progress Notes (Signed)
Pt's daughter has SNF bed offers and will tour facilities this afternoon. Expect to hear back from family in am with SNF choice. CSW will continue to assist with d/c planning to SNF.

## 2011-04-13 NOTE — Progress Notes (Signed)
Physical Therapy Treatment Patient Details Name: Destiny Cochran MRN: [id number] DOB: July 06, 1916 Today's Date: 04/13/2011 10:10-10:25 G  PT Assessment/Plan  PT - Assessment/Plan Comments on Treatment Session: pt confused, not oriented to place/situation, somewhat impulsive, but can follow directions. Fatigues quickly. PT Plan: Discharge plan remains appropriate PT Frequency: Min 3X/week Follow Up Recommendations: Skilled nursing facility Equipment Recommended: Defer to next venue PT Goals  Acute Rehab PT Goals PT Goal Formulation: Patient unable to participate in goal setting Time For Goal Achievement: 2 weeks Pt will Roll Supine to Right Side: with min assist Pt will go Supine/Side to Sit: with min assist PT Goal: Supine/Side to Sit - Progress:  (not addressed today) Pt will go Sit to Supine/Side: with mod assist;with rail Pt will go Sit to Stand: with supervision PT Goal: Sit to Stand - Progress: Progressing toward goal Pt will go Stand to Sit: with supervision PT Goal: Stand to Sit - Progress: Progressing toward goal Pt will Ambulate: 51 - 150 feet;with min assist;with rolling walker PT Goal: Ambulate - Progress: Progressing toward goal  PT Treatment Precautions/Restrictions  Precautions Precautions: Fall Precaution Comments: abdominal wound Restrictions Weight Bearing Restrictions: No Mobility (including Balance) Bed Mobility Bed Mobility: No Transfers Transfers: Yes Sit to Stand: From chair/3-in-1;With armrests;With upper extremity assist;3: Mod assist Sit to Stand Details (indicate cue type and reason): VCs hand placement, assist to achieve upright position, pt 65% Stand to Sit: To chair/3-in-1;With armrests;With upper extremity assist;4: Min assist Stand to Sit Details: pt 80%, assist to control descent, VCs hand placement Ambulation/Gait Ambulation/Gait: Yes Ambulation/Gait Assistance: 4: Min assist Ambulation/Gait Assistance Details (indicate cue type and reason):  VCs to increase step length and to hold head up Ambulation Distance (Feet): 18 Feet Assistive device: Rolling walker Gait Pattern: Step-to pattern    Exercise    End of Session PT - End of Session Equipment Utilized During Treatment: Gait belt Activity Tolerance: Patient limited by fatigue Patient left: with call bell in reach;in chair (chair alarm on) Nurse Communication: Mobility status for transfers;Mobility status for ambulation (pt confused) General Behavior During Session: [facility] for tasks performed Cognition: Impaired  Tamala Ser 04/13/2011, 10:39 AM  Tamala Ser PT 04/13/2011  [phone]

## 2011-04-13 NOTE — Progress Notes (Signed)
Daughter has accepted SNF bed offer from [facility]. Bed will be available for pt when ready for d/c . CSW will assist with d/c planning to SNF.

## 2011-04-13 NOTE — Progress Notes (Signed)
8 Days Post-Op  Subjective: Sitting up in chair, seemed less confused and stated she was feeling good, but couldn't recall when last BM was, but 3 recorded in flowsheet yesterday.  Objective: Vital signs in last 24 hours: Temp:  [97.4 F (36.3 C)-98.3 F (36.8 C)] 98.3 F (36.8 C) (01/30 0545) Pulse Rate:  [83-91] 91  (01/30 0545) Resp:  [20-24] 20  (01/30 0545) BP: (135-145)/(72-80) 144/80 mmHg (01/30 0545) SpO2:  [95 %-99 %] 99 % (01/30 0545) Last BM Date: 04/12/11  Intake/Output this shift:    Physical Exam: BP 144/80  Pulse 91  Temp(Src) 98.3 F (36.8 C) (Oral)  Resp 20  Ht 5\' 6"  (1.676 m)  Wt 99.3 kg (218 lb 14.7 oz)  BMI 35.33 kg/m2  SpO2 99% Abdomen: wound pink, clean, no odor.  Labs: CBC  Basename 04/11/11 0923  WBC 10.6*  HGB 12.7  HCT 38.5  PLT 390   BMET  Basename 04/12/11 0452 04/11/11 0923  NA 135 136  K 3.8 3.4*  CL 106 104  CO2 21 24  GLUCOSE 122* 115*  BUN 20 15  CREATININE 0.94 0.83  CALCIUM 8.2* 8.1*   LFT No results found for this basename: PROT,ALBUMIN,AST,ALT,ALKPHOS,BILITOT,BILIDIR,IBILI,LIPASE in the last 72 hours PT/INR No results found for this basename: LABPROT:2,INR:2 in the last 72 hours ABG No results found for this basename: PHART:2,PCO2:2,PO2:2,HCO3:2 in the last 72 hours  Studies/Results: Dg Abd 1 View  04/11/2011  *RADIOLOGY REPORT*  Clinical Data: Nausea and vomiting.  Postop ileus.  ABDOMEN - 1 VIEW  Comparison: 04/05/2011  Findings: Distended loops of small and large bowel are scattered across abdomen.  No obvious free intraperitoneal gas on this supine patient.  Osteopenia and lumbar dextroscoliosis are stable.  Severe degenerative change of the hip joints.  IMPRESSION: Mild ileus pattern.  Original Report Authenticated By: Donavan Burnet, M.D.    Assessment: Principal Problem:  *SBO (small bowel obstruction) Active Problems:  Small bowel tumor   Procedure(s): EXPLORATORY LAPAROTOMY  Plan: Advance diet  again SNF planning. Wound care  LOS: 8 days    Alyse Low 04/13/2011 8:39 AM

## 2011-04-14 MED ORDER — HYDROCODONE-ACETAMINOPHEN 5-325 MG PO TABS: 1.0000 | ORAL_TABLET | ORAL | Status: AC | PRN

## 2011-04-14 NOTE — Discharge Summary (Signed)
Physician Discharge Summary  Patient ID: Destiny Cochran MRN: [id number] DOB/AGE: February 10, 1917 78 y.o.  Admit date: 04/05/2011 Discharge date: 04/14/2011  Admission Diagnoses: Closed loop SBO Discharge Diagnoses:  Principal Problem:  *SBO secondary to benign mass. Chronic constipation Osteoporosis Dementia Urinary Incontinence.   Procedures: Ex Lap with partial (R)colectomy and partial SB resection, 04/05/2011  Discharged Condition: good  Hospital Course: HPI: This is a very pleasant 78 yo WF who has had difficulty with constipation over the last several years. She has come to the ED twice this past year for an inability to have a bowel movement. She was given Miralax and discharged back to assisted living.  Last night the patient started developing RLQ abdominal pain along with nausea and emesis. She was awake all night and decided to call her daughter secondary to pain. She did have a BM this morning. She was brought here to the [facility] and had a CT scan that revealed a closed loop small bowel obstruction and a small bowel tumor that measured 8x6cm. We have been called to see the patient for surgical admission  After evaluation by Dr. Luisa Hart, the patient was admitted for surgical management. She was taken to the OR on 04/05/11 and underwent ex lap, with (R)colectomy and partial SB resection of the mid-jejunum. Post-operatively, she has done fairly well. She did have some deconditioning requiring therapy. Her bowel function slowly returned and she was started on clears diet and advanced. She did have an episode of N/V and an x-ray showed mild ileus on POD#6, however, she quickly improved. She is now moving her bowels regularly and tolerating regular diet. Her wound was packed open with NS gauze and has looked well, showing no signs of infection.  She has maintained a baseline level of dementia, which is likely her baseline. She answers appropriately most of the time but has some trouble with memory.  No focal deficits noted.  We feel she is stable at this point to discharge back to skilled nursing facility and this has been arranged. Her foley was removed and she has been able to void on her own without difficulty.  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  Discharge Exam: Blood pressure 135/71, pulse 60, temperature 98 F (36.7 C), temperature source Oral, resp. rate 22, height 5\' 6"  (1.676 m), weight 99.3 kg (218 lb 14.7 oz), SpO2 98.00%. Gen: NAD, baseline mild confusion. Lungs: CTA without w/r/r Heart: Regular Abdomen: soft, ND, appropriately tender   Wound open, fascia intact, tissues pink and    viable, no drainage or odor. Ext: No edema or tenderness.   Disposition: Skilled Nursing Facility  Discharge Orders    Future Orders Please Complete By Expires   Diet - low sodium heart healthy      Increase activity slowly      May shower / Bathe      Walk with assistance      Discharge wound care:      Comments:   Normal saline moistened gauze packing to abdominal wound. Cover with dry dressing. Change twice daily.   Call MD for:  redness, tenderness, or signs of infection (pain, swelling, redness, odor or green/yellow discharge around incision site)      Call MD for:  severe uncontrolled pain      Call MD for:  persistant nausea and vomiting      Call MD for:  temperature >100.4        Medication List  As of  04/14/2011 12:19 PM   TAKE these medications         alendronate 70 MG tablet   Commonly known as: FOSAMAX   Take 70 mg by mouth every 7 (seven) days. On Friday. Take with a full glass of water on an empty stomach.      amoxicillin 500 MG capsule   Commonly known as: AMOXIL   Take 500 mg by mouth once as needed. One hour before dental appointment      Calcium 600+D 600-400 MG-UNIT per tablet   Generic drug: Calcium Carbonate-Vitamin D   Take 1 tablet by mouth daily.      cholecalciferol 1000 UNITS tablet    Commonly known as: VITAMIN D   Take 1,000 Units by mouth daily.      Chromium 200 MCG Tabs   Take 1 tablet by mouth daily.      docusate sodium 100 MG capsule   Commonly known as: COLACE   Take 100 mg by mouth 2 (two) times daily as needed.      FIRST-DUKES MOUTHWASH MT   Use as directed 15 mLs in the mouth or throat every 3 (three) hours as needed. For sore mouth.      folic acid 1 MG tablet   Commonly known as: FOLVITE   Take 1 mg by mouth daily.      furosemide 20 MG tablet   Commonly known as: LASIX   Take 20 mg by mouth daily as needed. For Edema      HYDROcodone-acetaminophen 5-325 MG per tablet   Commonly known as: NORCO   Take 1 tablet by mouth every 4 (four) hours as needed (Severe pain).      ibuprofen 200 MG tablet   Commonly known as: ADVIL,MOTRIN   Take 400 mg by mouth 3 (three) times daily with meals as needed. For pain      lactulose 10 GM/15ML solution   Commonly known as: CHRONULAC   Take 20 g by mouth every 4 (four) hours as needed. For Bowel Movement      magnesium hydroxide 400 MG/5ML suspension   Commonly known as: MILK OF MAGNESIA   Take 30 mLs by mouth 2 (two) times daily as needed. For constipation      mulitivitamin with minerals Tabs   Take 1 tablet by mouth daily.      omega-3 acid ethyl esters 1 G capsule   Commonly known as: LOVAZA   Take 1 g by mouth daily.      oxybutynin 10 MG 24 hr tablet   Commonly known as: DITROPAN-XL   Take 10 mg by mouth at bedtime.      polyethylene glycol packet   Commonly known as: MIRALAX / GLYCOLAX   Take 17 g by mouth daily as needed. For constipation      Potassium 99 MG Tabs   Take 1 tablet by mouth daily.      pyridOXINE 50 MG tablet   Commonly known as: VITAMIN B-6   Take 50 mg by mouth daily.      Selenium 200 MCG Tbcr   Take 1 tablet by mouth daily.      traMADol 50 MG tablet   Commonly known as: ULTRAM   Take 50 mg by mouth 2 (two) times daily.      vitamin B-12 500 MCG tablet    Commonly known as: CYANOCOBALAMIN   Take 500 mcg by mouth daily.      vitamin C 500 MG tablet  Commonly known as: ASCORBIC ACID   Take 500 mg by mouth daily.      vitamin E 400 UNIT capsule   Generic drug: vitamin E   Take 400 Units by mouth daily.           Follow-up Information    Follow up with CORNETT,THOMAS A., MD. Schedule an appointment as soon as possible for a visit in 2 weeks. (Call  as needed)    Contact information:   [employer] [address] [place] [address] [phone]          Signed: Alyse Low 04/14/2011, 12:19 PM

## 2011-04-14 NOTE — Progress Notes (Signed)
Pt to be d/c to [facility] via [facility] transport toay for SNF placement.

## 2011-04-14 NOTE — Progress Notes (Signed)
9 Days Post-Op  Subjective: Pt ok. Several good BMs. Tol reg diet well.   Objective: Vital signs in last 24 hours: Temp:  [97.3 F (36.3 C)-98.3 F (36.8 C)] 98 F (36.7 C) (01/31 0540) Pulse Rate:  [52-72] 52  (01/31 0540) Resp:  [16-20] 20  (01/31 0540) BP: (131-152)/(58-71) 152/71 mmHg (01/31 0540) SpO2:  [96 %-98 %] 96 % (01/31 0540) Last BM Date: 04/13/11  Intake/Output this shift:    Physical Exam: BP 152/71  Pulse 52  Temp(Src) 98 F (36.7 C) (Oral)  Resp 20  Ht 5\' 6"  (1.676 m)  Wt 99.3 kg (218 lb 14.7 oz)  BMI 35.33 kg/m2  SpO2 96% Abdomen: soft, ND Wound clean, pink, no odor  Labs: CBC  Basename 04/11/11 0923  WBC 10.6*  HGB 12.7  HCT 38.5  PLT 390   BMET  Basename 04/12/11 0452 04/11/11 0923  NA 135 136  K 3.8 3.4*  CL 106 104  CO2 21 24  GLUCOSE 122* 115*  BUN 20 15  CREATININE 0.94 0.83  CALCIUM 8.2* 8.1*   LFT No results found for this basename: PROT,ALBUMIN,AST,ALT,ALKPHOS,BILITOT,BILIDIR,IBILI,LIPASE in the last 72 hours PT/INR No results found for this basename: LABPROT:2,INR:2 in the last 72 hours ABG No results found for this basename: PHART:2,PCO2:2,PO2:2,HCO3:2 in the last 72 hours  Studies/Results: No results found.  Assessment: Principal Problem:  *SBO (small bowel obstruction) Active Problems:  Small bowel tumor   Procedure(s): EXPLORATORY LAPAROTOMY  Plan: Will DC foley Otherwise she is stable for DC to SNF later today  LOS: 9 days    Marianna Fuss PA-C 04/14/2011 9:22 AM

## 2011-04-14 NOTE — Progress Notes (Signed)
Patient interviewed and examined, agree with PA note above.  Evangelyne Loja T Clemon Devaul MD, FACS  04/14/2011 5:29 PM   

## 2011-04-19 ENCOUNTER — Telehealth (INDEPENDENT_AMBULATORY_CARE_PROVIDER_SITE_OTHER): Payer: Self-pay | Admitting: Surgery

## 2011-04-19 NOTE — Telephone Encounter (Signed)
Patient discharged on 04/14/11, needs a 2wk po appt, facility only comes to [place] on tuesdays and thursdays, please call.

## 2011-05-05 ENCOUNTER — Ambulatory Visit (INDEPENDENT_AMBULATORY_CARE_PROVIDER_SITE_OTHER): Payer: Medicare Other | Admitting: Surgery

## 2011-05-05 ENCOUNTER — Encounter (INDEPENDENT_AMBULATORY_CARE_PROVIDER_SITE_OTHER): Payer: Self-pay | Admitting: Surgery

## 2011-05-05 ENCOUNTER — Encounter (INDEPENDENT_AMBULATORY_CARE_PROVIDER_SITE_OTHER): Payer: Medicare Other | Admitting: Surgery

## 2011-05-05 VITALS — BP 114/66 | HR 76 | Temp 98.0°F | Resp 12 | Ht 62.0 in | Wt 191.4 lb

## 2011-05-05 DIAGNOSIS — Z9889 Other specified postprocedural states: Secondary | ICD-10-CM

## 2011-05-05 NOTE — Patient Instructions (Signed)
Follow up 6 weeks.  Once a day wound care. Stop laxatives.

## 2011-05-05 NOTE — Progress Notes (Signed)
Patient returns after exploratory laparotomy for small bowel infarction secondary to a leimyoma of the small bowel. She underwent an ileocecectomy and a separate segment of distal jejunum was resected. She is doing well.  Exam: Midline wound open but clean. Dressing change today. Abdomen soft nontender.  Impression: Status post exploratory laparotomy and small bowel resection with ileal discectomy secondary to leiomyoma of small bowel  Plan: Reduce home health visits to once a day. Followup in 6 weeks.

## 2011-06-27 ENCOUNTER — Ambulatory Visit (INDEPENDENT_AMBULATORY_CARE_PROVIDER_SITE_OTHER): Payer: Medicare Other | Admitting: Surgery

## 2011-06-27 ENCOUNTER — Encounter (INDEPENDENT_AMBULATORY_CARE_PROVIDER_SITE_OTHER): Payer: Self-pay | Admitting: Surgery

## 2011-06-27 VITALS — BP 130/70 | HR 68 | Temp 97.2°F | Resp 18 | Ht 64.0 in | Wt 199.2 lb

## 2011-06-27 DIAGNOSIS — Z9889 Other specified postprocedural states: Secondary | ICD-10-CM

## 2011-06-27 NOTE — Patient Instructions (Signed)
OK to shower.  Leave dressing off.  Return as needed.  Ok to take miralax daily.

## 2011-06-27 NOTE — Progress Notes (Signed)
Patient returns after exploratory laparotomy. She is doing well. She has some issue constipation relieved by taking MiraLax.  Exam: Midline wound healed. Dressing removed. Soft nontender abdomen  Impression: Status post exploratory laparotomy and small bowel resection secondary to leiomyoma  Plan: She is released from our care. Okay to shower. Okay to leave wound open.

## 2011-08-23 ENCOUNTER — Ambulatory Visit (INDEPENDENT_AMBULATORY_CARE_PROVIDER_SITE_OTHER): Payer: Medicare Other | Admitting: General Surgery

## 2012-07-08 ENCOUNTER — Emergency Department (HOSPITAL_COMMUNITY): Payer: Medicare Other

## 2012-07-08 ENCOUNTER — Encounter (HOSPITAL_COMMUNITY): Payer: Self-pay | Admitting: Emergency Medicine

## 2012-07-08 ENCOUNTER — Emergency Department (HOSPITAL_COMMUNITY)
Admission: EM | Admit: 2012-07-08 | Discharge: 2012-07-08 | Disposition: A | Discharge status: Skilled Nursing Facility | Payer: Medicare Other | Attending: Emergency Medicine | Admitting: Emergency Medicine

## 2012-07-08 DIAGNOSIS — M549 Dorsalgia, unspecified: Secondary | ICD-10-CM

## 2012-07-08 DIAGNOSIS — Z79899 Other long term (current) drug therapy: Secondary | ICD-10-CM | POA: Insufficient documentation

## 2012-07-08 DIAGNOSIS — M545 Low back pain, unspecified: Secondary | ICD-10-CM | POA: Insufficient documentation

## 2012-07-08 DIAGNOSIS — M810 Age-related osteoporosis without current pathological fracture: Secondary | ICD-10-CM | POA: Insufficient documentation

## 2012-07-08 DIAGNOSIS — Z8719 Personal history of other diseases of the digestive system: Secondary | ICD-10-CM | POA: Insufficient documentation

## 2012-07-08 NOTE — ED Provider Notes (Signed)
Medical screening examination/treatment/procedure(s) were performed by non-physician practitioner and as supervising physician I was immediately available for consultation/collaboration.  Elivia Robotham, MD 07/08/12 0737 

## 2012-07-08 NOTE — ED Provider Notes (Signed)
History     CSN: [id number]  Arrival date & time 07/08/12  0447   First MD Initiated Contact with Patient 07/08/12 0454      Chief Complaint  Patient presents with  . Back Pain    (Consider location/radiation/quality/duration/timing/severity/associated sxs/prior treatment) HPI Comments: Patient was at a nursing facility, reported, that she fell a week ago.  She had no injury.  At that time.  No pain at that time.  Has been doing well until this morning, when she got up at her normal.  Time 3 AM she was walking around her room.  When she suddenly developed excruciating lumbar pain.  That does not radiate.  She was given a Tylenol, and transported to the emergency department for further evaluation.  She has no previous history of back injury, but does carry a diagnosis of osteoporosis.  She does have a number of pain.  Medications available to her including Vicodin, and tramadol, which were not administered  Patient is a 79 y.o. female presenting with back pain. The history is provided by the patient.  Back Pain Location:  Lumbar spine Quality:  Aching Radiates to:  Does not radiate Pain severity:  Mild Onset quality:  Unable to specify Timing:  Constant Chronicity:  New Associated symptoms: no chest pain, no dysuria, no numbness and no weakness     Past Medical History  Diagnosis Date  . Osteoporosis   . Constipation   . SBO (small bowel obstruction)     Past Surgical History  Procedure Laterality Date  . Cholecystectomy  1975  . Bilateral total shoulder arthroplasties    . Cataract extraction, bilateral    . Laparotomy  04/05/2011    Procedure: EXPLORATORY LAPAROTOMY;  Surgeon: Clovis Pu. Cornett, MD;  Location: [facility] ORS;  Service: General;  Laterality: N/A;  Cecectomy, small bowel resection  . Small intestine surgery      Family History  Problem Relation Age of Onset  . Cancer Mother     pancreatic    History  Substance Use Topics  . Smoking status: Never Smoker     . Smokeless tobacco: Never Used  . Alcohol Use: No    OB History   Grav Para Term Preterm Abortions TAB SAB Ect Mult Living                  Review of Systems  Respiratory: Negative for shortness of breath.   Cardiovascular: Negative for chest pain and leg swelling.  Gastrointestinal: Negative for diarrhea and constipation.  Genitourinary: Negative for dysuria and flank pain.  Musculoskeletal: Positive for back pain.  Neurological: Negative for weakness and numbness.  All other systems reviewed and are negative.    Allergies  Review of patient's allergies indicates no known allergies.  Home Medications   Current Outpatient Rx  Name  Route  Sig  Dispense  Refill  . acetaminophen (MAPAP) 500 MG tablet   Oral   Take 500 mg by mouth every 6 (six) hours as needed for pain.         . Calcium Carbonate-Vitamin D (CALCIUM 600+D) 600-400 MG-UNIT per tablet   Oral   Take 1 tablet by mouth daily.         . cholecalciferol (VITAMIN D) 1000 UNITS tablet   Oral   Take 1,000 Units by mouth daily.         . folic acid (FOLVITE) 1 MG tablet   Oral   Take 1 mg by mouth daily.         Marland Kitchen  guaiFENesin (ROBITUSSIN) 100 MG/5ML SOLN   Oral   Take 10 mLs by mouth every 4 (four) hours as needed. For cough         . HYDROcodone-acetaminophen (NORCO/VICODIN) 5-325 MG per tablet   Oral   Take 1 tablet by mouth every 4 (four) hours as needed for pain.         . Multiple Vitamin (MULITIVITAMIN WITH MINERALS) TABS   Oral   Take 1 tablet by mouth daily.         Marland Kitchen omega-3 acid ethyl esters (LOVAZA) 1 G capsule   Oral   Take 1 g by mouth daily.         Marland Kitchen oxybutynin (DITROPAN-XL) 10 MG 24 hr tablet   Oral   Take 10 mg by mouth at bedtime.         . polyethylene glycol (MIRALAX / GLYCOLAX) packet   Oral   Take 17 g by mouth every 3 (three) days.         . traMADol (ULTRAM) 50 MG tablet   Oral   Take 50 mg by mouth 2 (two) times daily.         Marland Kitchen alendronate  (FOSAMAX) 70 MG tablet   Oral   Take 70 mg by mouth every 7 (seven) days. On Friday. Take with a full glass of water on an empty stomach.         Marland Kitchen amoxicillin (AMOXIL) 500 MG capsule   Oral   Take 500 mg by mouth once as needed. One hour before dental appointment         . docusate sodium (COLACE) 100 MG capsule   Oral   Take 100 mg by mouth 2 (two) times daily as needed.         . lactulose (CHRONULAC) 10 GM/15ML solution   Oral   Take 20 g by mouth every 4 (four) hours as needed. For Bowel Movement         . magnesium hydroxide (MILK OF MAGNESIA) 400 MG/5ML suspension   Oral   Take 30 mLs by mouth 2 (two) times daily as needed. For constipation           BP 145/68  Pulse 65  Temp(Src) 98 F (36.7 C) (Oral)  Resp 16  SpO2 100%  Physical Exam  Nursing note and vitals reviewed. Constitutional: She appears well-developed and well-nourished.  Eyes: Pupils are equal, round, and reactive to light.  Neck: Normal range of motion.  Cardiovascular: Normal rate and regular rhythm.   Pulmonary/Chest: Effort normal and breath sounds normal.  Abdominal: Soft. Bowel sounds are normal. She exhibits no distension. There is no tenderness.  Musculoskeletal: She exhibits edema and tenderness.       Lumbar back: She exhibits bony tenderness and pain. She exhibits no swelling and no spasm.       Back:  Pain does not radiate  Neurological: She is alert.  Skin: Skin is warm. No erythema. No pallor.  There is no bruising at the pain.  Site    ED Course  Procedures (including critical care time)  Labs Reviewed - No data to display Dg Lumbar Spine Complete  07/08/2012  *RADIOLOGY REPORT*  Clinical Data: Back pain after fall 2 weeks ago.  LUMBAR SPINE - COMPLETE 4+ VIEW  Comparison: 07/18/2010  Findings: Five lumbar type vertebral bodies.  Mild lumbar scoliosis convex towards the right is likely degenerative in stable since previous study.  Normal alignment of the lumbar vertebrae  and facet joints.  Compression of the T12 vertebrae is stable since previous study.  Diffuse degenerative change throughout the lumbar spine with narrowed lumbar interspaces with associated endplate hypertrophic changes.  Degenerative changes in the facet joints. No focal bone lesion or bone destruction.  No significant changes since previous study.  Incidental finding of degenerative changes in the hips.  IMPRESSION: Degenerative changes in the lumbar spine.  Anterior compression of T12.  Stable appearance since previous study.  No acute displaced fractures identified.   Original Report Authenticated By: Burman Nieves, M.D.      1. Back pain       MDM           Arman Filter, NP 07/08/12 2156

## 2012-07-08 NOTE — ED Notes (Signed)
I have just phoned [facility] .  Pt. Is in no distress.

## 2012-07-08 NOTE — ED Provider Notes (Signed)
Patient care transferred from Earley Favor, NP, pending lumbar film which is negative for acute findings. Patient ambulated to bathroom, steady gait. C/P mild back pain. Stable for discharge.   Arnoldo Hooker, PA-C 07/08/12 [phone]

## 2012-07-08 NOTE — ED Notes (Signed)
Pt arrived from [facility] with a complaint of lower back pain.  Pt reportedly fell a week ago, but reported no pain or injury at that time.  Pt Usually waked up around 3am every morning.  This morning patient complained of left lower back pain.  Per EMS the nursing facility gave her a tylenol and then called EMS when pain was not relieved.

## 2012-07-08 NOTE — ED Notes (Signed)
[id number]> Expected date:<BR> Expected time:<BR> Means of arrival:<BR> Comments:<BR> EMS  79 yr old  Low back pain

## 2012-07-09 NOTE — ED Provider Notes (Signed)
Medical screening examination/treatment/procedure(s) were performed by non-physician practitioner and as supervising physician I was immediately available for consultation/collaboration.  Sunnie Nielsen, MD 07/09/12 [phone]

## 2013-04-18 ENCOUNTER — Emergency Department (HOSPITAL_COMMUNITY)
Admission: EM | Admit: 2013-04-18 | Discharge: 2013-04-18 | Disposition: A | Discharge status: Home / Self Care | Payer: Medicare Other | Attending: Emergency Medicine | Admitting: Emergency Medicine

## 2013-04-18 ENCOUNTER — Encounter (HOSPITAL_COMMUNITY): Payer: Self-pay | Admitting: Emergency Medicine

## 2013-04-18 DIAGNOSIS — L539 Erythematous condition, unspecified: Secondary | ICD-10-CM | POA: Insufficient documentation

## 2013-04-18 DIAGNOSIS — K5900 Constipation, unspecified: Secondary | ICD-10-CM | POA: Insufficient documentation

## 2013-04-18 DIAGNOSIS — M79609 Pain in unspecified limb: Secondary | ICD-10-CM

## 2013-04-18 DIAGNOSIS — E669 Obesity, unspecified: Secondary | ICD-10-CM | POA: Insufficient documentation

## 2013-04-18 DIAGNOSIS — M7989 Other specified soft tissue disorders: Secondary | ICD-10-CM

## 2013-04-18 DIAGNOSIS — F0390 Unspecified dementia without behavioral disturbance: Secondary | ICD-10-CM | POA: Insufficient documentation

## 2013-04-18 DIAGNOSIS — M810 Age-related osteoporosis without current pathological fracture: Secondary | ICD-10-CM | POA: Insufficient documentation

## 2013-04-18 DIAGNOSIS — R609 Edema, unspecified: Secondary | ICD-10-CM | POA: Insufficient documentation

## 2013-04-18 LAB — CBC WITH DIFFERENTIAL/PLATELET
BASOS ABS: 0 10*3/uL (ref 0.0–0.1)
BASOS PCT: 1 % (ref 0–1)
EOS ABS: 0.2 10*3/uL (ref 0.0–0.7)
Eosinophils Relative: 4 % (ref 0–5)
HCT: 37.8 % (ref 36.0–46.0)
Hemoglobin: 12.3 g/dL (ref 12.0–15.0)
Lymphocytes Relative: 20 % (ref 12–46)
Lymphs Abs: 1.2 10*3/uL (ref 0.7–4.0)
MCH: 30.4 pg (ref 26.0–34.0)
MCHC: 32.5 g/dL (ref 30.0–36.0)
MCV: 93.6 fL (ref 78.0–100.0)
MONOS PCT: 9 % (ref 3–12)
Monocytes Absolute: 0.5 10*3/uL (ref 0.1–1.0)
NEUTROS ABS: 4 10*3/uL (ref 1.7–7.7)
NEUTROS PCT: 67 % (ref 43–77)
PLATELETS: 271 10*3/uL (ref 150–400)
RBC: 4.04 MIL/uL (ref 3.87–5.11)
RDW: 13.7 % (ref 11.5–15.5)
WBC: 6 10*3/uL (ref 4.0–10.5)

## 2013-04-18 LAB — COMPREHENSIVE METABOLIC PANEL
ALBUMIN: 3.4 g/dL — AB (ref 3.5–5.2)
ALK PHOS: 78 U/L (ref 39–117)
ALT: 9 U/L (ref 0–35)
AST: 12 U/L (ref 0–37)
BILIRUBIN TOTAL: 0.2 mg/dL — AB (ref 0.3–1.2)
BUN: 25 mg/dL — AB (ref 6–23)
CHLORIDE: 105 meq/L (ref 96–112)
CO2: 25 mEq/L (ref 19–32)
Calcium: 9.4 mg/dL (ref 8.4–10.5)
Creatinine, Ser: 0.82 mg/dL (ref 0.50–1.10)
GFR calc Af Amer: 69 mL/min — ABNORMAL LOW (ref >90)
GFR calc non Af Amer: 59 mL/min — ABNORMAL LOW (ref >90)
Glucose, Bld: 102 mg/dL — ABNORMAL HIGH (ref 70–99)
POTASSIUM: 4.4 meq/L (ref 3.7–5.3)
Sodium: 141 mEq/L (ref 137–147)
TOTAL PROTEIN: 6.2 g/dL (ref 6.0–8.3)

## 2013-04-18 LAB — URINALYSIS, ROUTINE W REFLEX MICROSCOPIC
Bilirubin Urine: NEGATIVE
GLUCOSE, UA: NEGATIVE mg/dL
HGB URINE DIPSTICK: NEGATIVE
Ketones, ur: NEGATIVE mg/dL
LEUKOCYTES UA: NEGATIVE
Nitrite: NEGATIVE
PH: 5.5 (ref 5.0–8.0)
Protein, ur: NEGATIVE mg/dL
Specific Gravity, Urine: 1.028 (ref 1.005–1.030)
Urobilinogen, UA: 0.2 mg/dL (ref 0.0–1.0)

## 2013-04-18 MED ORDER — FUROSEMIDE 20 MG PO TABS: 20.0000 mg | ORAL_TABLET | Freq: Every day | ORAL | Status: AC

## 2013-04-18 NOTE — ED Notes (Signed)
Per EMS patient from facility reports to ED for lower extremity pitting edema, worse on right leg with pain on right leg. Per EMS patient is at mental baseline with some dementia.

## 2013-04-18 NOTE — ED Provider Notes (Signed)
CSN: [id number]     Arrival date & time 04/18/13  1430 History   First MD Initiated Contact with Patient 04/18/13 1511     Chief Complaint  Patient presents with  . Leg Swelling   (Consider location/radiation/quality/duration/timing/severity/associated sxs/prior Treatment) The history is provided by the patient.   Destiny Cochran is a 80 y.o. female who lives in a nursing care facility. Today, a therapist noted that her right leg was more swollen than left, so her providers sent her in for evaluation. There are no other noted concerns. The patient cannot give history.   Level V caveat: Dementia   Past Medical History  Diagnosis Date  . Osteoporosis   . Constipation   . SBO (small bowel obstruction)    Past Surgical History  Procedure Laterality Date  . Cholecystectomy  1975  . Bilateral total shoulder arthroplasties    . Cataract extraction, bilateral    . Laparotomy  04/05/2011    Procedure: EXPLORATORY LAPAROTOMY;  Surgeon: Joyice Faster. Cornett, MD;  Location: [facility];  Service: General;  Laterality: N/A;  Cecectomy, small bowel resection  . Small intestine surgery     Family History  Problem Relation Age of Onset  . Cancer Mother     pancreatic   History  Substance Use Topics  . Smoking status: Never Smoker   . Smokeless tobacco: Never Used  . Alcohol Use: No   OB History   Grav Para Term Preterm Abortions TAB SAB Ect Mult Living                 Review of Systems  Unable to perform ROS   Allergies  Review of patient's allergies indicates no known allergies.  Home Medications   Current Outpatient Rx  Name  Route  Sig  Dispense  Refill  . Calcium Carbonate-Vitamin D (CALCIUM 600+D) 600-400 MG-UNIT per tablet   Oral   Take 1 tablet by mouth every morning.          . cholecalciferol (VITAMIN D) 1000 UNITS tablet   Oral   Take 1,000 Units by mouth every morning.          . docusate sodium (COLACE) 100 MG capsule   Oral   Take 100 mg by mouth 2 (two) times  daily as needed for mild constipation.          . folic acid (FOLVITE) 1 MG tablet   Oral   Take 1 mg by mouth every morning.          Marland Kitchen HYDROcodone-acetaminophen (NORCO/VICODIN) 5-325 MG per tablet   Oral   Take 1 tablet by mouth 3 (three) times daily as needed for severe pain.          . Multiple Vitamin (MULITIVITAMIN WITH MINERALS) TABS   Oral   Take 1 tablet by mouth every morning.          Marland Kitchen oxybutynin (DITROPAN-XL) 10 MG 24 hr tablet   Oral   Take 10 mg by mouth every evening.          . polyethylene glycol (MIRALAX / GLYCOLAX) packet   Oral   Take 17 g by mouth every 3 (three) days.         . furosemide (LASIX) 20 MG tablet   Oral   Take 1 tablet (20 mg total) by mouth daily.   30 tablet   0    BP 152/67  Pulse 66  Temp(Src) 98.2 F (36.8 C) (Oral)  Resp  16  SpO2 97% Physical Exam  Nursing note and vitals reviewed. Constitutional: She appears well-developed.  Elderly, obese  HENT:  Head: Normocephalic and atraumatic.  Eyes: Conjunctivae and EOM are normal. Pupils are equal, round, and reactive to light.  Neck: Normal range of motion and phonation normal. Neck supple.  Cardiovascular: Normal rate, regular rhythm and intact distal pulses.   No JVD  Pulmonary/Chest: Effort normal and breath sounds normal. She exhibits no tenderness.  Abdominal: Soft. She exhibits no distension. There is no tenderness. There is no guarding.  Musculoskeletal: Normal range of motion. She exhibits edema (4 post right leg, 3+ left leg. Upper and lower legs are mildly tender, bilaterally.).  Neurological: She is alert. She exhibits normal muscle tone. Coordination normal.  Skin: Skin is warm and dry. There is erythema (minimal diffuse erythema of the right leg, without pattern.).  Psychiatric: She has a normal mood and affect. Her behavior is normal.    ED Course  Procedures (including critical care time)  Medications - No data to display  Patient Vitals for the  past 24 hrs:  BP Temp Temp src Pulse Resp SpO2  04/18/13 1711 152/67 mmHg - - 66 16 97 %  04/18/13 1442 169/58 mmHg 98.2 F (36.8 C) Oral - - -  04/18/13 1437 - - - 63 - 96 %   1540- Venous duplex imaging order to evaluate for the presence of DVT. Case discussed with the vascular technician after test performed. No DVT, bilateral. Ruptured Baker's cyst, right leg.  4:09 PM Reevaluation with update and discussion. After initial assessment and treatment, an updated evaluation reveals no further complaints. Destiny Cochran L    Labs Review Labs Reviewed  COMPREHENSIVE METABOLIC PANEL - Abnormal; Notable for the following:    Glucose, Bld 102 (*)    BUN 25 (*)    Albumin 3.4 (*)    Total Bilirubin 0.2 (*)    GFR calc non Af Amer 59 (*)    GFR calc Af Amer 69 (*)    All other components within normal limits  URINE CULTURE  CBC WITH DIFFERENTIAL  URINALYSIS, ROUTINE W REFLEX MICROSCOPIC     MDM   1. Peripheral edema      Moderate peripheral edema, without findings for cellulitis, DVT, congestive heart failure or metabolic instability. Her renal function is normal. She's not currently on a diuretic medication. I think it is safe to try low dose furosemide to improve her swelling, with close monitoring by her primary care doctor.  Nursing Notes Reviewed/ Care Coordinated Applicable Imaging Reviewed Interpretation of Laboratory Data incorporated into ED treatment  The patient appears reasonably screened and/or stabilized for discharge and I doubt any other medical condition or other [facility] requiring further screening, evaluation, or treatment in the ED at this time prior to discharge.  Plan: Home Medications- Lasix 20 mg daily; Home Treatments- rest, elevate, mild exercise; return here if the recommended treatment, does not improve the symptoms; Recommended follow up- PCP follow up in 3-5 day   Richarda Blade, MD 04/18/13 [phone]

## 2013-04-18 NOTE — ED Notes (Signed)
Bed: XU38 Expected date:  Expected time:  Means of arrival:  Comments: EMS- Elderly, leg pain/swelling

## 2013-04-18 NOTE — Discharge Instructions (Signed)
Elevate your legs above your heart, when, you are resting. Try to get some exercise each day.    Peripheral Edema You have swelling in your legs (peripheral edema). This swelling is due to excess accumulation of salt and water in your body. Edema may be a sign of heart, kidney or liver disease, or a side effect of a medication. It may also be due to problems in the leg veins. Elevating your legs and using special support stockings may be very helpful, if the cause of the swelling is due to poor venous circulation. Avoid long periods of standing, whatever the cause. Treatment of edema depends on identifying the cause. Chips, pretzels, pickles and other salty foods should be avoided. Restricting salt in your diet is almost always needed. Water pills (diuretics) are often used to remove the excess salt and water from your body via urine. These medicines prevent the kidney from reabsorbing sodium. This increases urine flow. Diuretic treatment may also result in lowering of potassium levels in your body. Potassium supplements may be needed if you have to use diuretics daily. Daily weights can help you keep track of your progress in clearing your edema. You should call your caregiver for follow up care as recommended. SEEK IMMEDIATE MEDICAL CARE IF:   You have increased swelling, pain, redness, or heat in your legs.  You develop shortness of breath, especially when lying down.  You develop chest or abdominal pain, weakness, or fainting.  You have a fever. Document Released: 04/07/2004 Document Revised: 05/23/2011 Document Reviewed: 03/18/2009 [facility] Patient Information 2014 [place].

## 2013-04-18 NOTE — Progress Notes (Addendum)
VASCULAR LAB PRELIMINARY  PRELIMINARY  PRELIMINARY  PRELIMINARY  Bilateral lower extremity venous duplex completed.    Preliminary report:  Bilateral:  No evidence of DVT, or superficial thrombosis. Right - There is an area of mixed echoes coursing from the popliteal fossa approximately 4.5 cm into the calf consistent with a small ruptured Baker"s cyst. Also noted is mild to moderate interstitial fluid in the popliteal fossa throughout the lower leg. Left - There is no evidence of a Baker's cyst.   Deland Slocumb, RVS 04/18/2013, 4:20 PM

## 2013-04-19 LAB — URINE CULTURE
CULTURE: NO GROWTH
Colony Count: NO GROWTH

## 2015-02-08 IMAGING — CR DG LUMBAR SPINE COMPLETE 4+V
5 series · 5 of 5 positions shown · non-contrast
Comparison: 07/18/2010

CLINICAL DATA: Back pain after fall 2 weeks ago.

LUMBAR SPINE - COMPLETE 4+ VIEW

[t lumbar spine ap]
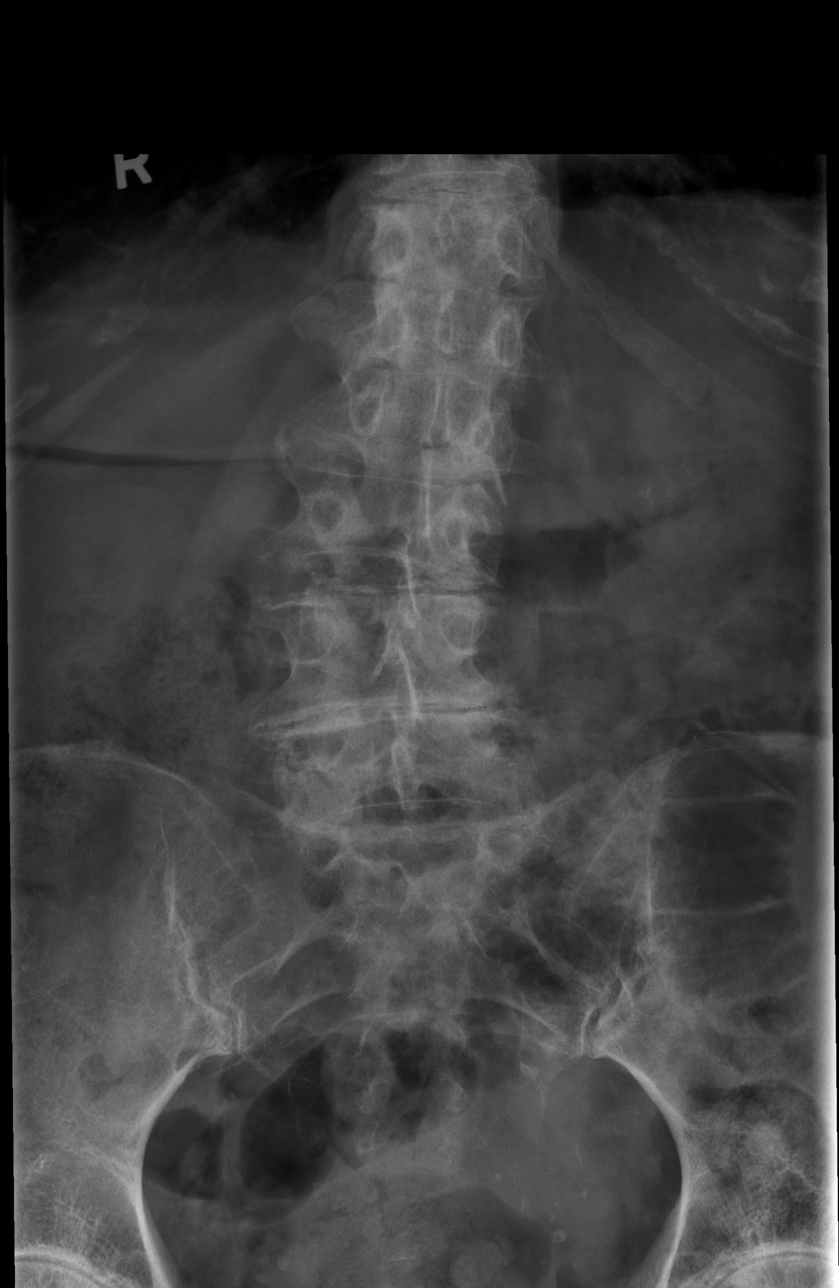

[t lumbar spine obl (1 of 2)]
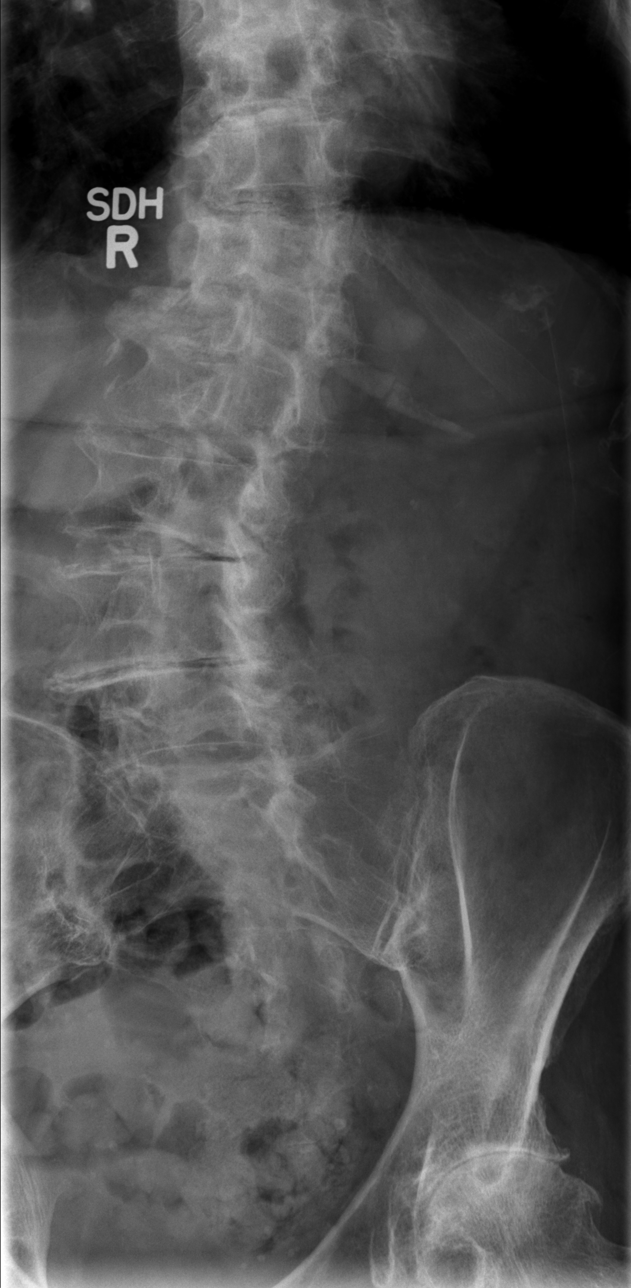

[t lumbar spine obl (2 of 2)]
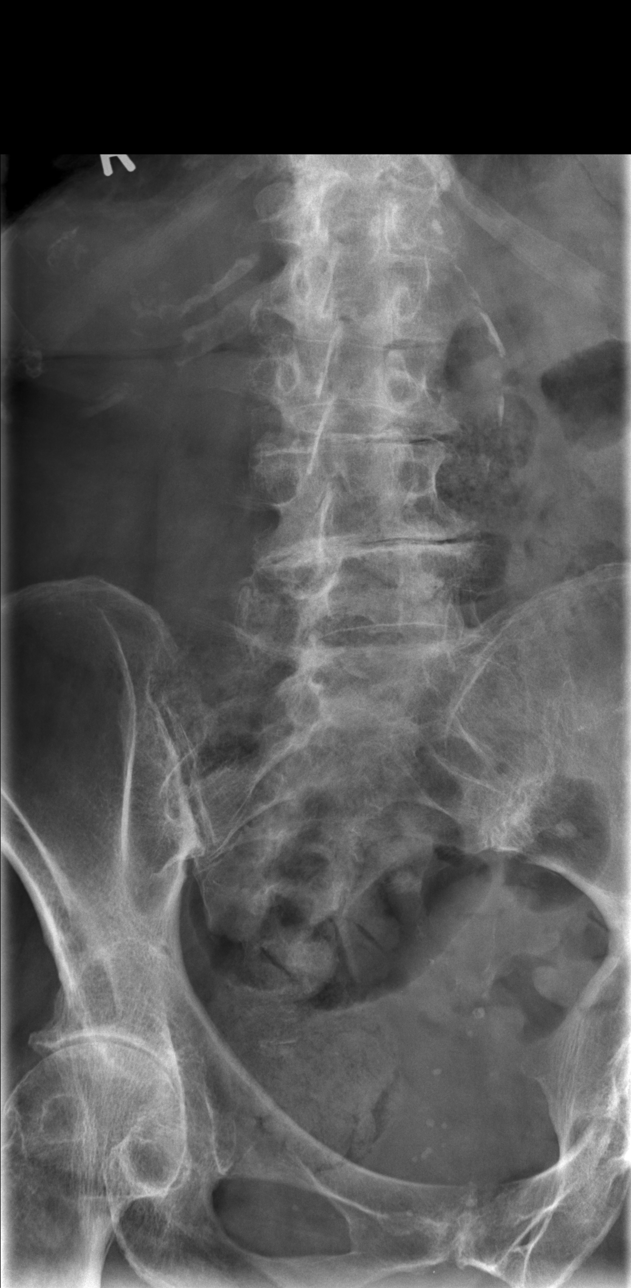

[t lumbar spine lat]
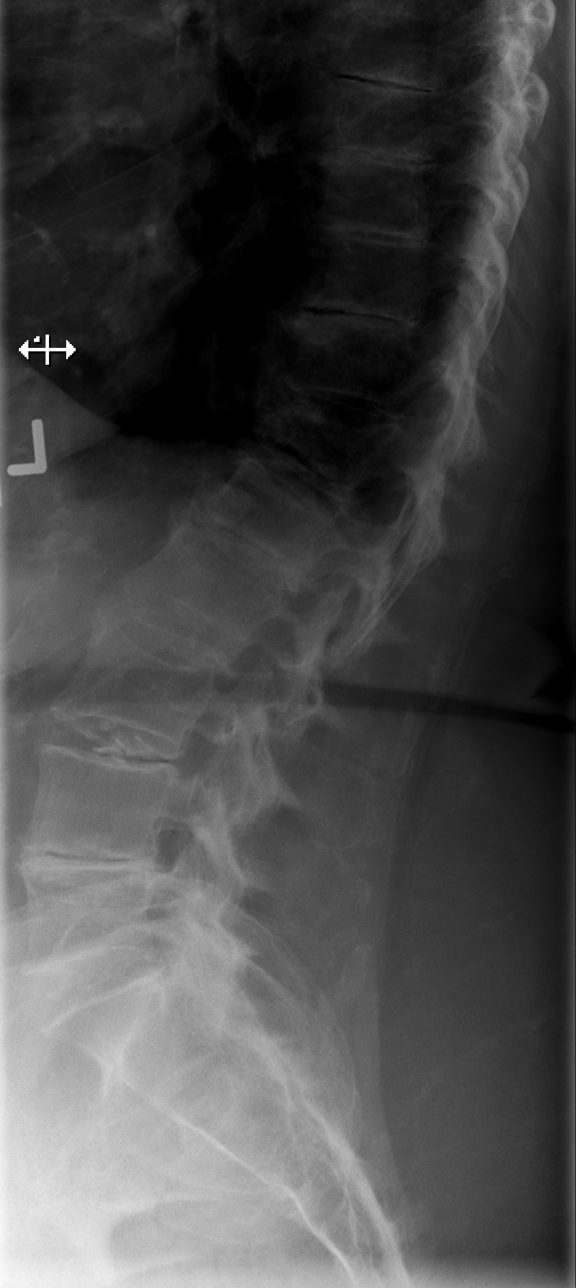

[t lumbar l-5 s-1 spot]
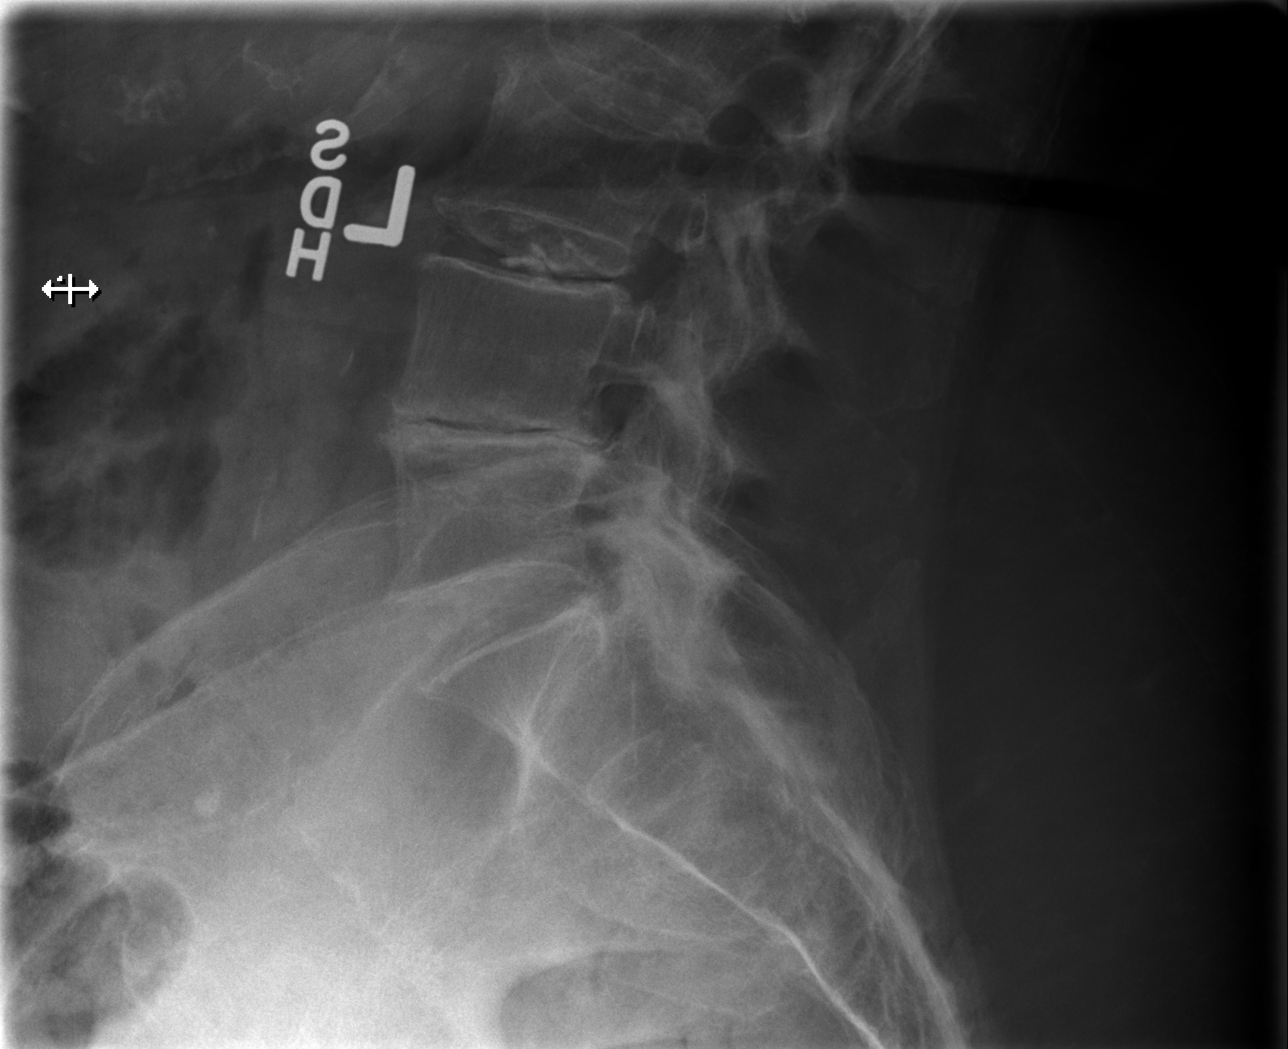

[5 of 5 positions shown; findings below may reference images not displayed]

FINDINGS: Five lumbar type vertebral bodies.  Mild lumbar scoliosis
convex towards the right is likely degenerative in stable since
previous study.  Normal alignment of the lumbar vertebrae and facet
joints.  Compression of the T12 vertebrae is stable since previous
study.  Diffuse degenerative change throughout the lumbar spine
with narrowed lumbar interspaces with associated endplate
hypertrophic changes.  Degenerative changes in the facet joints.
No focal bone lesion or bone destruction.  No significant changes
since previous study.  Incidental finding of degenerative changes
in the hips.
IMPRESSION: Degenerative changes in the lumbar spine.  Anterior compression of
T12.  Stable appearance since previous study.  No acute displaced
fractures identified.

## 2015-06-13 DEATH — deceased
